# Patient Record
Sex: Male | Born: 1945 | Race: White | Hispanic: No | Marital: Married | Smoking: Former smoker
Health system: Southern US, Community
[De-identification: ages and names within clinical notes are randomized; demographics above are authoritative.]

## PROBLEM LIST (undated history)

## (undated) DIAGNOSIS — S73004A Unspecified dislocation of right hip, initial encounter: Secondary | ICD-10-CM

## (undated) DIAGNOSIS — L309 Dermatitis, unspecified: Secondary | ICD-10-CM

## (undated) DIAGNOSIS — S73005A Unspecified dislocation of left hip, initial encounter: Secondary | ICD-10-CM

## (undated) DIAGNOSIS — J302 Other seasonal allergic rhinitis: Secondary | ICD-10-CM

## (undated) DIAGNOSIS — I10 Essential (primary) hypertension: Secondary | ICD-10-CM

## (undated) DIAGNOSIS — J45909 Unspecified asthma, uncomplicated: Secondary | ICD-10-CM

## (undated) HISTORY — PX: TONSILLECTOMY: SUR1361

## (undated) HISTORY — DX: Unspecified dislocation of left hip, initial encounter: S73.005A

## (undated) HISTORY — DX: Dermatitis, unspecified: L30.9

## (undated) HISTORY — PX: ADENOIDECTOMY: SUR15

## (undated) HISTORY — DX: Essential (primary) hypertension: I10

## (undated) HISTORY — DX: Unspecified asthma, uncomplicated: J45.909

## (undated) HISTORY — DX: Unspecified dislocation of right hip, initial encounter: S73.004A

## (undated) HISTORY — DX: Other seasonal allergic rhinitis: J30.2

---

## 2013-10-10 ENCOUNTER — Other Ambulatory Visit: Payer: Self-pay | Admitting: Internal Medicine

## 2013-10-10 DIAGNOSIS — Z0000 Encounter for general adult medical examination without abnormal findings: Secondary | ICD-10-CM

## 2013-10-17 ENCOUNTER — Ambulatory Visit
Admission: RE | Admit: 2013-10-17 | Discharge: 2013-10-17 | Disposition: A | Discharge status: Home / Self Care | Payer: Medicare Other | Source: Ambulatory Visit | Attending: Internal Medicine | Admitting: Internal Medicine

## 2013-10-17 DIAGNOSIS — Z0000 Encounter for general adult medical examination without abnormal findings: Secondary | ICD-10-CM

## 2016-04-28 ENCOUNTER — Other Ambulatory Visit: Payer: Self-pay | Admitting: Allergy

## 2017-04-16 ENCOUNTER — Ambulatory Visit (INDEPENDENT_AMBULATORY_CARE_PROVIDER_SITE_OTHER): Payer: Medicare Other | Admitting: Allergy

## 2017-04-16 ENCOUNTER — Encounter: Payer: Self-pay | Admitting: Allergy

## 2017-04-16 VITALS — BP 134/70 | HR 112 | Temp 97.6°F | Resp 20 | Ht 63.0 in | Wt 158.2 lb

## 2017-04-16 DIAGNOSIS — L2084 Intrinsic (allergic) eczema: Secondary | ICD-10-CM

## 2017-04-16 DIAGNOSIS — J3089 Other allergic rhinitis: Secondary | ICD-10-CM

## 2017-04-16 DIAGNOSIS — J4520 Mild intermittent asthma, uncomplicated: Secondary | ICD-10-CM | POA: Diagnosis not present

## 2017-04-16 NOTE — Patient Instructions (Signed)
Eczema (hand)   - will start approval process for Dupixent as you have shown great improvement in eczema with use of Dupixenxt injectable medications every 2 weeks.     - will provide with prednisone 30mg  x 5 days to use the week before wedding to help clear hands    - will we try to get dupixent sample if possible before you are officially approved    - continue use of vaseline application multiple times a day and wear cotton gloves to help keep moisturised.      - will provide with Eucrisa samples you may try twice a day (may burn with initial applications).     Follow-up 3 months or sooner if needed

## 2017-04-16 NOTE — Progress Notes (Signed)
New Patient Note  RE: Grant Gonzalez MRN: [id number] DOB: 1946/08/15 Date of Office Visit: 04/16/2017  Referring provider: No ref. provider found Primary care provider: Kirby Funk, MD  Chief Complaint: eczema  History of present illness: Grant Gonzalez is a 71 y.o. male presenting today for evaluation of eczema.  He has eczema mostly on his palms that is rather severe.  He also reports he has an area on his feet that is not as bad as his hands. His hands are scaling and cracking and very erythematous.  He reports he has tried a variety of different topical steroid at different potencies that he reports that never been effective.  Upon naming a variety of different topical steroid creams he definitely reports he has tried clobetasol which had no effect.  He also feels like he has used either Elidel or Protopic in the past but doesn't remember which one.  He states he has at least 4-5 tubes of creams at home that he doesn't use because nothing has been effective except for Dupixent.  He does report using Vaseline for moisturization Mila Palmer applies multiple times a day and cover with a cotton white glove.  He was a part of the Dupixent trials for atopic dermatitis and reports he had a drastic improvement in his hands.  He even states that it worked weight faster than he expected it to. He reports he had no problems doing self injections every other week.  His last injection was in May.  He reports he was given the option to continue on with Dupixent however it was going to cost him $200 he was not able to do this.   He has had a flare in his eczema since being off Dupixent.   His daughter is getting married in 2 weeks and would like for his hands to look better.    He also has a history of mild intermittent asthma and he just recently was prescribed Aerospan by PCP that he has been using twice a day.  He has an albuterol inhaler that he has not needed to use with use of Aerospan.  He denies an  ynighttime awakenings and has not had any steroids for management of his asthma in past year.  He also takes singulair nightly.    He does have history of seasonal allergies however he states he has not had significant issues and does not take any antihistamines routinely.  He has used flonase in the past but none recently.     Per the clinical trial documentation he was enrolled in 3 Dupilimab trials  and had a drastic improvement. His BSA went from 39% initially to less than 10% in the course of 4 months. His EASI went from 17.6-0.8 in the same timeframe. His IgA was 3 and 0.  Review of systems: Review of Systems  Constitutional: Negative for chills, fever and malaise/fatigue.  HENT: Negative for congestion, ear discharge, ear pain, nosebleeds, sinus pain, sore throat and tinnitus.   Eyes: Negative for discharge and redness.  Respiratory: Positive for cough. Negative for sputum production, shortness of breath and wheezing.   Cardiovascular: Negative for chest pain.  Gastrointestinal: Negative for abdominal pain, constipation, diarrhea, heartburn, nausea and vomiting.  Musculoskeletal: Negative for joint pain.  Skin: Positive for itching and rash.  Neurological: Negative for headaches.    All other systems negative unless noted above in HPI  Past medical history: Past Medical History:  Diagnosis Date  . Asthma   . Eczema   .  Hip dislocation   . Hypertension   . Seasonal allergies     Past surgical history: Past Surgical History:  Procedure Laterality Date  . ADENOIDECTOMY    . TONSILLECTOMY      Family history:  Family History  Problem Relation Age of Onset  . Asthma Daughter   . Allergic rhinitis Neg Hx   . Angioedema Neg Hx   . Eczema Neg Hx   . Immunodeficiency Neg Hx   . Urticaria Neg Hx     Social history: He lives in a home with carpeting with central cooling. There is a dog in the home. There is no concern for water damage, mildew or roaches in the home he  is retired.  Social History Main Topics  . Smoking status: Former Smoker    Quit date: 08/17/1987  . Smokeless tobacco: Never Used     Medication List: Allergies as of 04/16/2017      Reactions   Penicillins Nausea Only      Medication List       Accurate as of 04/16/17  5:11 PM. Always use your most recent med list.          AEROSPAN 80 MCG/ACT Aers Generic drug:  Flunisolide HFA Inhale 2 puffs into the lungs 2 (two) times daily.   aspirin 81 MG chewable tablet Chew 81 mg by mouth daily.   lisinopril-hydrochlorothiazide 20-25 MG tablet Commonly known as:  PRINZIDE,ZESTORETIC   montelukast 10 MG tablet Commonly known as:  SINGULAIR   vitamin B-12 1000 MCG tablet Commonly known as:  CYANOCOBALAMIN Take 1,000 mcg by mouth daily.   Vitamin D (Cholecalciferol) 1000 units Caps Take 1 capsule by mouth daily.            Discharge Care Instructions        Start     Ordered   04/16/17 0000  Spirometry with Graph    Question Answer Comment  Where should this test be performed? Other   Basic spirometry Yes      04/16/17 1704      Known medication allergies: Allergies  Allergen Reactions  . Penicillins Nausea Only     Physical examination: Blood pressure 134/70, pulse (!) 112, temperature 97.6 F (36.4 C), temperature source Oral, resp. rate 20, height 5\' 3"  (1.6 m), weight 158 lb 3.2 oz (71.8 kg), SpO2 97 %.  General: Alert, interactive, in no acute distress. HEENT: PERRLA, TMs pearly gray, turbinates mildly edematous without discharge, post-pharynx non erythematous. Neck: Supple without lymphadenopathy. Lungs: Clear to auscultation without wheezing, rhonchi or rales. {no increased work of breathing. CV: Normal S1, S2 without murmurs. Abdomen: Nondistended, nontender. Skin: palms bilaterally very erythematous with cracking and several flaking over entire palmar surface. Extremities:  No clubbing, cyanosis or edema. Neuro:   Grossly  intact.  Diagnositics/Labs:  Spirometry: FEV1: 1.87L  77%, FVC: 3.06L  92%, ratio consistent with nonobstructive pattern normal for age  Assessment and plan:   Atopic dermatitis(hand)   - he has severe atopic dermatitis of his hands.  He has very impressive improvement in his hands while on Dupixent.  After completion of trial he was opted to continue however this was too expensive for him and he stopped use.  His eczema returned and is very problematic now.  He has tried varied potencies of topical steroid creams and he believes he has tried non-steroidal agent all without much benefit.  Will start approval process for Dupixent so that he can resume.     -  will provide with prednisone 30mg  x 5 days to use the week before wedding to help clear hands    - continue use of vaseline application multiple times a day and wear cotton gloves to help keep moisturised.      - will provide with Eucrisa samples you may try twice a day (may burn with initial applications).    Asthma, mild intermittent    - he will continue Aerospan use twice a day as previously directed    - have access to albuterol inhaler 2 puffs every 4-6 hours as needed for cough/wheeze/shortness of breath/chest tightness.  May use 15-20 minutes prior to activity.   Monitor frequency of use.    Asthma control goals:   Full participation in all desired activities (may need albuterol before activity)  Albuterol use two time or less a week on average (not counting use with activity)  Cough interfering with sleep two time or less a month  Oral steroids no more than once a year  No hospitalizations  Sesonal allergic rhinitis     - at this time no persistent symptoms     - use OTC antihistamine (Allegra, Zyrtec, Xyzal) as needed  Follow-up 3 months or sooner if needed   I appreciate the opportunity to take part in Revel's care. Please do not hesitate to contact me with questions.  Sincerely,   Margo Aye,  MD Allergy/Immunology [facility]

## 2017-07-14 ENCOUNTER — Ambulatory Visit: Payer: Medicare Other | Admitting: Allergy

## 2017-09-08 ENCOUNTER — Encounter: Payer: Self-pay | Admitting: Allergy

## 2017-09-08 ENCOUNTER — Ambulatory Visit: Payer: Medicare Other | Admitting: Allergy

## 2017-09-08 VITALS — BP 130/72 | HR 92 | Resp 18

## 2017-09-08 DIAGNOSIS — J3089 Other allergic rhinitis: Secondary | ICD-10-CM

## 2017-09-08 DIAGNOSIS — L2084 Intrinsic (allergic) eczema: Secondary | ICD-10-CM | POA: Diagnosis not present

## 2017-09-08 DIAGNOSIS — J4520 Mild intermittent asthma, uncomplicated: Secondary | ICD-10-CM | POA: Diagnosis not present

## 2017-09-08 NOTE — Patient Instructions (Addendum)
Eczema (hand)   - continue dupixent injections every 2 weeks   - continue use of vaseline application multiple times a day and wear cotton gloves to help keep moisturised as you need to.      - continue use of Eucrisa as needed  Asthma, mild intermittent    - continue Aerospan use twice a day during asthma flares/respiratory illnesses    - have access to albuterol inhaler 2 puffs every 4-6 hours as needed for cough/wheeze/shortness of breath/chest tightness.  May use 15-20 minutes prior to activity.   Monitor frequency of use.    Asthma control goals:   Full participation in all desired activities (may need albuterol before activity)  Albuterol use two time or less a week on average (not counting use with activity)  Cough interfering with sleep two time or less a month  Oral steroids no more than once a year  No hospitalizations  Sesonal allergic rhinitis     - use OTC antihistamine (Allegra, Zyrtec, Xyzal) as needed  Follow-up this fall or sooner if needed

## 2017-09-08 NOTE — Progress Notes (Signed)
Follow-up Note  RE: Grant Gonzalez MRN: [id number] DOB: 1945-10-21 Date of Office Visit: 09/08/2017   History of present illness: Grant Gonzalez is a 72 y.o. male presenting today for follow-up of eczema of the hands, asthma and allergic rhinitis.  He was last seen in the office on  04/16/17 by myself.  At this last visit he was having severe eczematous flare of his hands and was going to be in his daugther's wedding several weeks last.  We did provide with steroid pack at this time as Dupixent was not approved for him and he reported he was unable to pay for it.  He did decide to go ahead and cover the cost for the medication which he says has been very steep for him but he'd rather pay for it them suffer through his hand eczema.  He just received noticed in December that he had been approved for Dupixent through the end of this year.  He has been using dupixent every 2 weeks via self administration and is tolerating the injections without issue.   In regards to his asthma he states he is doing well.  Since last visit he states he has had 2 occasions when he has needed to use his Grant Gonzalez which he reports use during illnesses.  He denies any albuterol use or oral steroid needs.  He also denies any significant nasal allergy or ocular allergy symptoms at this time and does report will take antihistamine as needed.    Review of systems: Review of Systems  Constitutional: Negative for chills, fever and malaise/fatigue.  HENT: Negative for congestion, ear discharge, ear pain, nosebleeds and sore throat.   Eyes: Negative for pain, discharge and redness.  Respiratory: Negative for cough, shortness of breath and wheezing.   Cardiovascular: Negative for chest pain.  Gastrointestinal: Negative for abdominal pain, constipation, diarrhea, heartburn, nausea and vomiting.  Musculoskeletal: Negative for joint pain and myalgias.  Skin: Positive for itching and rash.       Improved symptoms  Neurological:  Negative for headaches.    All other systems negative unless noted above in HPI  Past medical/social/surgical/family history have been reviewed and are unchanged unless specifically indicated below.  No changes  Medication List: Allergies as of 09/08/2017      Reactions   Penicillins Nausea Only      Medication List        Accurate as of 09/08/17  3:52 PM. Always use your most recent med list.          AEROSPAN 80 MCG/ACT Aers Generic drug:  Flunisolide HFA Inhale 2 puffs into the lungs 2 (two) times daily.   aspirin 81 MG chewable tablet Chew 81 mg by mouth daily.   lisinopril-hydrochlorothiazide 20-25 MG tablet Commonly known as:  PRINZIDE,ZESTORETIC   montelukast 10 MG tablet Commonly known as:  SINGULAIR   vitamin B-12 1000 MCG tablet Commonly known as:  CYANOCOBALAMIN Take 1,000 mcg by mouth daily.   Vitamin D (Cholecalciferol) 1000 units Caps Take 1 capsule by mouth daily.       Known medication allergies: Allergies  Allergen Reactions  . Penicillins Nausea Only     Physical examination: Blood pressure 130/72, pulse 92, resp. rate 18, SpO2 98 %.  General: Alert, interactive, in no acute distress. HEENT: PERRLA, TMs pearly gray, turbinates minimally edematous without discharge, post-pharynx non erythematous. Neck: Supple without lymphadenopathy. Lungs: Clear to auscultation without wheezing, rhonchi or rales. {no increased work of breathing. CV: Normal S1, S2 without  murmurs. Abdomen: Nondistended, nontender. Skin: mild erythema with dryness to left palm otherwise hands look much improved since last visit. Extremities:  No clubbing, cyanosis or edema. Neuro:   Grossly intact.  Diagnositics/Labs:  Spirometry: FEV1: 1.86L  76%, FVC: 2.7L  81%, ratio consistent with nonobstructive pattern for age  Assessment and plan:   Eczema (hand)   - continue dupixent injections every 2 weeks.  Hand eczema is much improved on Dupixent.     - continue use  of vaseline application multiple times a day and wear cotton gloves to help keep moisturised as you need to.      - continue use of Eucrisa as needed  Asthma, mild intermittent    - continue Aerospan use twice a day during asthma flares/respiratory illnesses    - have access to albuterol inhaler 2 puffs every 4-6 hours as needed for cough/wheeze/shortness of breath/chest tightness.  May use 15-20 minutes prior to activity.   Monitor frequency of use.    Asthma control goals:   Full participation in all desired activities (may need albuterol before activity)  Albuterol use two time or less a week on average (not counting use with activity)  Cough interfering with sleep two time or less a month  Oral steroids no more than once a year  No hospitalizations  Sesonal allergic rhinitis     - use OTC antihistamine (Allegra, Zyrtec, Xyzal) as needed  Follow-up this fall or sooner if needed  I appreciate the opportunity to take part in Grant Gonzalez's care. Please do not hesitate to contact me with questions.  Sincerely,   Grant AyeShaylar Felma Pfefferle, MD Allergy/Immunology [facility]

## 2017-11-09 ENCOUNTER — Encounter: Payer: Self-pay | Admitting: Sports Medicine

## 2017-11-09 ENCOUNTER — Ambulatory Visit: Payer: Medicare Other | Admitting: Sports Medicine

## 2017-11-09 VITALS — BP 152/99 | HR 83 | Resp 16

## 2017-11-09 DIAGNOSIS — M79609 Pain in unspecified limb: Principal | ICD-10-CM

## 2017-11-09 DIAGNOSIS — I739 Peripheral vascular disease, unspecified: Secondary | ICD-10-CM | POA: Diagnosis not present

## 2017-11-09 DIAGNOSIS — M79676 Pain in unspecified toe(s): Secondary | ICD-10-CM | POA: Diagnosis not present

## 2017-11-09 DIAGNOSIS — B351 Tinea unguium: Secondary | ICD-10-CM

## 2017-11-09 NOTE — Progress Notes (Signed)
Subjective: Grant Gonzalez is a 72 y.o. male patient seen today in office with complaint of mildly painful thickened and elongated toenails; unable to trim. Patient denies history of Diabetes or Neuropathy. Patient has no other pedal complaints at this time.   Review of Systems  All other systems reviewed and are negative.    There are no active problems to display for this patient.   Current Outpatient Medications on File Prior to Visit  Medication Sig Dispense Refill  . aspirin 81 MG chewable tablet Chew 81 mg by mouth daily.    . DUPIXENT 300 MG/2ML SOSY Gets injection every 2 weeks    . Flunisolide HFA (AEROSPAN) 80 MCG/ACT AERS Inhale 2 puffs into the lungs 2 (two) times daily.    Marland Kitchen. lisinopril-hydrochlorothiazide (PRINZIDE,ZESTORETIC) 20-25 MG tablet     . montelukast (SINGULAIR) 10 MG tablet     . vitamin B-12 (CYANOCOBALAMIN) 1000 MCG tablet Take 1,000 mcg by mouth daily.    . Vitamin D, Cholecalciferol, 1000 units CAPS Take 1 capsule by mouth daily.     No current facility-administered medications on file prior to visit.     Allergies  Allergen Reactions  . Penicillins Nausea Only    Objective: Physical Exam  General: Well developed, nourished, no acute distress, awake, alert and oriented x 3  Vascular: Dorsalis pedis artery 1/4 bilateral, Posterior tibial artery 0/4 bilateral, skin temperature warm to warm proximal to distal bilateral lower extremities, Mild varicosities, no pedal hair present bilateral.  Neurological: Gross sensation present via light touch bilateral.   Dermatological: Skin is warm, dry, and supple bilateral, Nails 1-10 are tender, long, thick, and discolored with mild subungal debris, no webspace macerations present bilateral, no open lesions present bilateral, no callus/corns/hyperkeratotic tissue present bilateral. No signs of infection bilateral.  Musculoskeletal: No symptomatic boney deformities noted bilateral. Muscular strength within normal  limits without painon range of motion. No pain with calf compression bilateral.  Assessment and Plan:  Problem List Items Addressed This Visit    None    Visit Diagnoses    Pain due to onychomycosis of nail    -  Primary   PVD (peripheral vascular disease) (HCC)          -Examined patient.  -Discussed treatment options for painful mycotic nails. -Mechanically debrided and reduced mycotic nails with sterile nail nipper and dremel nail file without incident. -Patient to return as needed for follow up evaluation or sooner if symptoms worsen.  Asencion Islamitorya Hinley Brimage, DPM

## 2018-03-03 ENCOUNTER — Ambulatory Visit: Payer: Medicare Other | Admitting: Allergy

## 2018-03-03 ENCOUNTER — Encounter: Payer: Self-pay | Admitting: Allergy

## 2018-03-03 VITALS — BP 140/80 | HR 75 | Temp 97.5°F | Resp 16 | Ht 66.0 in | Wt 156.6 lb

## 2018-03-03 DIAGNOSIS — J3089 Other allergic rhinitis: Secondary | ICD-10-CM | POA: Diagnosis not present

## 2018-03-03 DIAGNOSIS — J4520 Mild intermittent asthma, uncomplicated: Secondary | ICD-10-CM | POA: Diagnosis not present

## 2018-03-03 DIAGNOSIS — L2084 Intrinsic (allergic) eczema: Secondary | ICD-10-CM

## 2018-03-03 NOTE — Patient Instructions (Addendum)
Eczema (hand)   - continue dupixent injections every 2 weeks.  He has had great results with use of Dupixent   -Continue at least twice a day moisturization with emollients like Vaseline, Aquaphor, CeraVe, Eucerin    - continue use of Eucrisa as needed if flared  Asthma, mild intermittent    - continue Arnuity 1 puff once a day    - have access to albuterol inhaler 2 puffs every 4-6 hours as needed for cough/wheeze/shortness of breath/chest tightness.  May use 15-20 minutes prior to activity.   Monitor frequency of use.    Asthma control goals:   Full participation in all desired activities (may need albuterol before activity)  Albuterol use two time or less a week on average (not counting use with activity)  Cough interfering with sleep two time or less a month  Oral steroids no more than once a year  No hospitalizations  Sesonal allergic rhinitis     - use OTC antihistamine (Allegra, Zyrtec, Xyzal) as needed  Follow-up 6-9 months or sooner if needed

## 2018-03-03 NOTE — Progress Notes (Signed)
Follow-up Note  RE: Grant Gonzalez MRN: [id number] DOB: [birth year] Date of Office Visit: 03/03/2018   History of present illness: Grant Gonzalez is a 72 y.o. male presenting today for follow-up of atopic dermatitis, asthma and allergic rhinitis.  He was last seen in the office on September 08, 2017 by myself.  He denies any major changes to his health, any surgeries or hospitalizations since this visit.  He continues on Dupixent injections every 2 weeks for his atopic dermatitis with good results.  He primarily has hand eczema that is being well controlled with the Dupixent.  He denies any site injection issues.  He does have access to [place] if he needed to for any flares.   With his asthma he states that his PCP changed him from [place] to Arnuity 200 he states that he does feel that the Arnuity is maintaining his asthma better than the Aerospan was.  He states he has not needed to use his albuterol in months.  He denies any nighttime awakenings.  He has not required any urgent care or ED visits and no oral steroid needs. With his allergies he reports some occasional runny nose but nothing severe enough that he takes any medications for.  He states he has not needed to use any over-the-counter antihistamine in over a month.  Review of systems: Review of Systems  Constitutional: Negative for chills, fever and malaise/fatigue.  HENT: Positive for congestion. Negative for ear discharge, ear pain, nosebleeds, sinus pain and sore throat.   Eyes: Negative for pain, discharge and redness.  Respiratory: Negative for cough, shortness of breath and wheezing.   Cardiovascular: Negative for chest pain.  Gastrointestinal: Negative for abdominal pain, constipation, diarrhea, heartburn, nausea and vomiting.  Musculoskeletal: Negative for joint pain.  Skin: Negative for itching and rash.  Neurological: Negative for headaches.    All other systems negative unless noted above in HPI  Past  medical/social/surgical/family history have been reviewed and are unchanged unless specifically indicated below.  No changes  Medication List: Allergies as of 03/03/2018      Reactions   Penicillins Nausea Only      Medication List        Accurate as of 03/03/18  1:29 PM. Always use your most recent med list.          AEROSPAN 80 MCG/ACT Aers Generic drug:  Flunisolide HFA Inhale 2 puffs into the lungs 2 (two) times daily.   ARNUITY ELLIPTA 200 MCG/ACT Aepb Generic drug:  Fluticasone Furoate TAKE 1 PUFF BY MOUTH EVERY DAY   DUPIXENT 300 MG/2ML Sosy Generic drug:  Dupilumab Gets injection every 2 weeks   lisinopril-hydrochlorothiazide 20-25 MG tablet Commonly known as:  PRINZIDE,ZESTORETIC   montelukast 10 MG tablet Commonly known as:  SINGULAIR   vitamin B-12 1000 MCG tablet Commonly known as:  CYANOCOBALAMIN Take 1,000 mcg by mouth daily.   Vitamin D (Cholecalciferol) 1000 units Caps Take 1 capsule by mouth daily.       Known medication allergies: Allergies  Allergen Reactions  . Penicillins Nausea Only     Physical examination: Blood pressure 140/80, pulse 75, temperature (!) 97.5 F (36.4 C), temperature source Oral, resp. rate 16, height 5\' 6"  (1.676 m), weight 156 lb 9.6 oz (71 kg), SpO2 97 %.  General: Alert, interactive, in no acute distress. HEENT: PERRLA, TMs pearly gray, turbinates minimally edematous without discharge, post-pharynx non erythematous. Neck: Supple without lymphadenopathy. Lungs: Clear to auscultation without wheezing, rhonchi or rales. {no increased work  of breathing. CV: Normal S1, S2 without murmurs. Abdomen: Nondistended, nontender. Skin: Palms of hands bilaterally are slightly dry.  Large bruise on his left forearm. Extremities:  No clubbing, cyanosis or edema. Neuro:   Grossly intact.  Diagnositics/Labs:  Spirometry: FEV1: 2.68L 96%, FVC: 2.86L 75%, ratio consistent with nonobstructive pattern for age  Assessment and  plan:   Eczema (hand)   - continue dupixent injections every 2 weeks.  He has had great results with use of Dupixent   -Continue at least twice a day moisturization with emollients like Vaseline, Aquaphor, CeraVe, Eucerin    - continue use of Eucrisa as needed if flared  Asthma, mild intermittent    - continue Arnuity 200mcg 1 puff once a day    - have access to albuterol inhaler 2 puffs every 4-6 hours as needed for cough/wheeze/shortness of breath/chest tightness.  May use 15-20 minutes prior to activity.   Monitor frequency of use.    Asthma control goals:   Full participation in all desired activities (may need albuterol before activity)  Albuterol use two time or less a week on average (not counting use with activity)  Cough interfering with sleep two time or less a month  Oral steroids no more than once a year  No hospitalizations  Sesonal allergic rhinitis     - use OTC antihistamine (Allegra, Zyrtec, Xyzal) as needed  Follow-up 6 to 9 months or sooner if needed  I appreciate the opportunity to take part in Grant Gonzalez's care. Please do not hesitate to contact me with questions.  Sincerely,   Margo AyeShaylar Josef Tourigny, MD Allergy/Immunology [facility]

## 2018-03-08 ENCOUNTER — Other Ambulatory Visit: Payer: Self-pay | Admitting: *Deleted

## 2018-03-08 MED ORDER — DUPIXENT 300 MG/2ML ~~LOC~~ SOSY: 300.0000 mg | PREFILLED_SYRINGE | SUBCUTANEOUS | 11 refills | Status: DC

## 2018-03-08 NOTE — Progress Notes (Signed)
Rx sent to [facility]

## 2018-10-11 ENCOUNTER — Telehealth: Payer: Self-pay

## 2018-10-11 NOTE — Telephone Encounter (Signed)
Patient states he has been having some vision issues starting towards the end of 2019. He thinks it may be a side effect to Dupixent. He states he doesn't think he is having any other issues.   Please Advise

## 2018-10-11 NOTE — Telephone Encounter (Signed)
Please advise 

## 2018-10-11 NOTE — Telephone Encounter (Signed)
He needs to come in for visit then to determine if he has conjunctivitis.   He should also see an eye doctor if he is having vision changes.  Dupixent can cause conjunctivitis however we do not normally see vision changes with it.

## 2018-10-11 NOTE — Telephone Encounter (Signed)
Informed patient that we typically on see conjunctivitis with Dupixent. He states he has an eye doctor appointment next week but wanted to double check since he has been on Dupixent for awhile now.

## 2018-10-28 ENCOUNTER — Other Ambulatory Visit: Payer: Self-pay | Admitting: Internal Medicine

## 2018-10-28 ENCOUNTER — Ambulatory Visit
Admission: RE | Admit: 2018-10-28 | Discharge: 2018-10-28 | Disposition: A | Discharge status: Home / Self Care | Payer: Medicare Other | Source: Ambulatory Visit | Attending: Internal Medicine | Admitting: Internal Medicine

## 2018-10-28 DIAGNOSIS — M25562 Pain in left knee: Secondary | ICD-10-CM

## 2018-12-28 ENCOUNTER — Other Ambulatory Visit: Payer: Self-pay | Admitting: Allergy

## 2018-12-28 NOTE — Telephone Encounter (Signed)
Please advise 

## 2019-05-23 ENCOUNTER — Ambulatory Visit
Admission: RE | Admit: 2019-05-23 | Discharge: 2019-05-23 | Disposition: A | Discharge status: Home / Self Care | Payer: Medicare Other | Source: Ambulatory Visit | Attending: Internal Medicine | Admitting: Internal Medicine

## 2019-05-23 ENCOUNTER — Other Ambulatory Visit: Payer: Self-pay | Admitting: Internal Medicine

## 2019-05-23 DIAGNOSIS — R053 Chronic cough: Secondary | ICD-10-CM

## 2019-05-23 DIAGNOSIS — R05 Cough: Secondary | ICD-10-CM

## 2019-08-03 ENCOUNTER — Other Ambulatory Visit: Payer: Self-pay

## 2019-08-03 ENCOUNTER — Ambulatory Visit (INDEPENDENT_AMBULATORY_CARE_PROVIDER_SITE_OTHER): Payer: Medicare Other | Admitting: Allergy

## 2019-08-03 ENCOUNTER — Encounter: Payer: Self-pay | Admitting: Allergy

## 2019-08-03 VITALS — BP 146/84 | HR 95 | Temp 98.0°F | Resp 18 | Ht 64.0 in | Wt 166.8 lb

## 2019-08-03 DIAGNOSIS — J3089 Other allergic rhinitis: Secondary | ICD-10-CM | POA: Diagnosis not present

## 2019-08-03 DIAGNOSIS — L2084 Intrinsic (allergic) eczema: Secondary | ICD-10-CM | POA: Diagnosis not present

## 2019-08-03 DIAGNOSIS — J4520 Mild intermittent asthma, uncomplicated: Secondary | ICD-10-CM

## 2019-08-03 NOTE — Patient Instructions (Addendum)
Eczema (hand)   - continue dupixent injections every 2 weeks.  He has had great results with use of Dupixent   - continue at least twice a day moisturization with emollients like Vaseline, Aquaphor, CeraVe, Eucerin   - continue use of Eucrisa as needed if flared   - do not think blurry vision is related to Dupixent use.  Do recommend you get in to see your optometrist, Dr. Nicki Reaper, soon for routine check-up and I will call his office to let him know regarding Dupixent use.    Asthma, mild intermittent    - Dupixent also helps with asthma control    - have access to albuterol inhaler 2 puffs every 4-6 hours as needed for cough/wheeze/shortness of breath/chest tightness.  May use 15-20 minutes prior to activity.   Monitor frequency of use.    Asthma control goals:   Full participation in all desired activities (may need albuterol before activity)  Albuterol use two time or less a week on average (not counting use with activity)  Cough interfering with sleep two time or less a month  Oral steroids no more than once a year  No hospitalizations  Sesonal allergic rhinitis     - use OTC antihistamine (Allegra, Zyrtec, Xyzal) as needed     - use Flonsae 2 sprays each nostril daily as needed for nasal congestion/drainage.  Use for 1-2 weeks at a time before stopping once symptoms improve  Follow-up 6-12 months or sooner if needed

## 2019-08-03 NOTE — Progress Notes (Signed)
Follow-up Note  RE: Karel Turpen MRN: [id number] DOB: [birth year] Date of Office Visit: 08/03/2019   History of present illness: Grant Gonzalez is a 73 y.o. male presenting today for follow-up of eczema, asthma and allergic rhinitis.  He was last seen in the office on 03/03/18 by myself.   He is on dupixent for hand eczema control which has been well controlled on dupixent every 2 weeks.  He denies any issues with the injections themselves.  He does state since it has gotten colder and drier that he is having my dryness but is using moisturizer.  He states he has not used any inhalers over the past year.  He denies any daytime or nighttime symptoms.  He denies any steroid needs, ED/UC visits.  With his allergic rhinitis he reports some nasal congestion/drainage with cough and was advised to use Flonase which he states helps.   He also states he has been having some blurred vision however states is not a new issue.  He states he saw his optometrist, Dr. Nicki Reaper, about a year ago.  He mentioned potentially having cataracts.  Denies eye pain.  No redness or discharge.  No headaches.  He states he wants to make a follow-up visit with eye doctor in the next quarter.   He became a grandfather yesterday!  Review of systems: Review of Systems  Constitutional: Negative.   HENT: Positive for congestion.   Eyes: Positive for blurred vision. Negative for pain, discharge and redness.  Respiratory: Negative.   Cardiovascular: Negative.   Gastrointestinal: Negative.   Musculoskeletal: Negative.   Skin: Negative.   Neurological: Negative.     All other systems negative unless noted above in HPI  Past medical/social/surgical/family history have been reviewed and are unchanged unless specifically indicated below.  No changes  Medication List: Current Outpatient Medications  Medication Sig Dispense Refill  . DUPIXENT 300 MG/2ML SOSY INJECT 300MG  SUBCUTANEOUSLY EVERY 14 DAYS 12 mL 11  .  lisinopril-hydrochlorothiazide (PRINZIDE,ZESTORETIC) 20-25 MG tablet     . montelukast (SINGULAIR) 10 MG tablet     . vitamin B-12 (CYANOCOBALAMIN) 1000 MCG tablet Take 1,000 mcg by mouth daily.    . Vitamin D, Cholecalciferol, 1000 units CAPS Take 1 capsule by mouth daily.     No current facility-administered medications for this visit.     Known medication allergies: Allergies  Allergen Reactions  . Penicillins Nausea Only     Physical examination: Blood pressure (!) 146/84, pulse 95, temperature 98 F (36.7 C), temperature source Temporal, resp. rate 18, height 5\' 4"  (1.626 m), weight 166 lb 12.8 oz (75.7 kg), SpO2 98 %.  General: Alert, interactive, in no acute distress. HEENT: PERRLA, no scleral injection, TMs pearly gray, turbinates minimally edematous without discharge, post-pharynx non erythematous. Neck: Supple without lymphadenopathy. Lungs: Clear to auscultation without wheezing, rhonchi or rales. {no increased work of breathing. CV: Normal S1, S2 without murmurs. Abdomen: Nondistended, nontender. Skin: Warm and dry, without lesions or rashes. Extremities:  No clubbing, cyanosis or edema. Neuro:   Grossly intact.  Diagnositics/Labs:  Spirometry: FEV1: 1.92L 76%, FVC: 2.86L 83%, ratio consistent with nonobstructive pattern.  normal for age  Assessment and plan:   Eczema (hand)   - continue dupixent injections every 2 weeks.  He has had great results with use of Dupixent   - continue at least twice a day moisturization with emollients like Vaseline, Aquaphor, CeraVe, Eucerin   - continue use of Eucrisa as needed if flared   - do  not think blurry vision is related to Dupixent use.  However I did call Dr. Roby Lofts office to make aware of symptoms and that he is on Dupixent and he advised pt make a "pathology" appt to evaluate further his symptoms.  Will notify pt of this recommendation.   Asthma, mild intermittent    - Dupixent also helps with asthma control    - have  access to albuterol inhaler 2 puffs every 4-6 hours as needed for cough/wheeze/shortness of breath/chest tightness.  May use 15-20 minutes prior to activity.   Monitor frequency of use.    Asthma control goals:   Full participation in all desired activities (may need albuterol before activity)  Albuterol use two time or less a week on average (not counting use with activity)  Cough interfering with sleep two time or less a month  Oral steroids no more than once a year  No hospitalizations  Sesonal allergic rhinitis     - use OTC antihistamine (Allegra, Zyrtec, Xyzal) as needed     - use Flonsae 2 sprays each nostril daily as needed for nasal congestion/drainage.  Use for 1-2 weeks at a time before stopping once symptoms improve  Follow-up 6-12 months or sooner if needed  I appreciate the opportunity to take part in Naif's care. Please do not hesitate to contact me with questions.  Sincerely,   Margo Aye, MD Allergy/Immunology [facility]

## 2019-08-04 NOTE — Progress Notes (Signed)
Called and talked with patient and advised. Patient verbalized understanding and will call Dr. Bary Leriche office to schedule an appointment.

## 2019-08-09 ENCOUNTER — Telehealth: Payer: Self-pay | Admitting: *Deleted

## 2019-08-09 NOTE — Telephone Encounter (Signed)
-----   Message from Delmar, MD sent at 08/03/2019  4:35 PM EST ----- Please call pt and let him know the following:"As discussed at visit I did call Dr. Bary Leriche office to make sure he is aware of your Dupixent use and discussed blurred vision.  He agreed you should call Dr. Bary Leriche office for a visit and need to tell them you are having blurry vision for a sooner appointment.  He stated they do have appointments available if having an eye issue.  Recommend he call to schedule this visit soon."Michele Kerlin --- can go ahead and renew his dupixent for the year

## 2019-08-09 NOTE — Telephone Encounter (Signed)
L/M for patient to contact me. I have done his approval and wanted to make sure he knows to schedule appt with Dr Nicki Reaper in regards to vision issues

## 2019-08-09 NOTE — Telephone Encounter (Signed)
Patient aware of info and will make appt  Dr Nicki Reaper

## 2019-09-21 ENCOUNTER — Ambulatory Visit: Payer: Medicare Other

## 2019-09-30 ENCOUNTER — Ambulatory Visit: Payer: Medicare Other | Attending: Internal Medicine

## 2019-09-30 DIAGNOSIS — Z23 Encounter for immunization: Secondary | ICD-10-CM | POA: Insufficient documentation

## 2019-09-30 NOTE — Progress Notes (Signed)
   Covid-19 Vaccination Clinic  Name:  Keelon Zurn    MRN: [id number] DOB: 06-12-1946  09/30/2019  Mr. Winnett was observed post Covid-19 immunization for 15 minutes without incidence. He was provided with Vaccine Information Sheet and instruction to access the V-Safe system.   Mr. Pecha was instructed to call 911 with any severe reactions post vaccine: Marland Kitchen Difficulty breathing  . Swelling of your face and throat  . A fast heartbeat  . A bad rash all over your body  . Dizziness and weakness    Immunizations Administered    Name Date Dose VIS Date Route   Pfizer COVID-19 Vaccine 09/30/2019  1:36 PM 0.3 mL 08/04/2019 Intramuscular   Manufacturer: [employer], [employer]   Lot: [id number]   NDC: [address]-7

## 2019-10-12 ENCOUNTER — Ambulatory Visit: Payer: Medicare Other

## 2019-10-22 ENCOUNTER — Ambulatory Visit: Payer: Medicare Other

## 2019-10-25 ENCOUNTER — Ambulatory Visit: Payer: Medicare Other | Attending: Internal Medicine

## 2019-10-25 DIAGNOSIS — Z23 Encounter for immunization: Secondary | ICD-10-CM | POA: Insufficient documentation

## 2019-10-25 NOTE — Progress Notes (Signed)
   Covid-19 Vaccination Clinic  Name:  Grant Gonzalez    MRN: [id number] DOB: 08-02-1946  10/25/2019  Mr. [name] was observed post Covid-19 immunization for 15 minutes without incident. He was provided with Vaccine Information Sheet and instruction to access the V-Safe system.   Mr. [name] was instructed to call 911 with any severe reactions post vaccine: Marland Kitchen Difficulty breathing  . Swelling of face and throat  . A fast heartbeat  . A bad rash all over body  . Dizziness and weakness   Immunizations Administered    Name Date Dose VIS Date Route   Pfizer COVID-19 Vaccine 10/25/2019 10:02 AM 0.3 mL 08/04/2019 Intramuscular   Manufacturer: [employer], [employer]   Lot: [id number]   NDC: [address]-4

## 2020-01-01 ENCOUNTER — Other Ambulatory Visit: Payer: Self-pay | Admitting: Allergy

## 2020-01-01 NOTE — Telephone Encounter (Signed)
Please advise 

## 2020-01-13 IMAGING — CR DG CHEST 2V
2 series · 2 of 2 positions shown · non-contrast
Comparison: None.

CLINICAL DATA: Chronic cough, shortness of breath.  Former smoker.

EXAM:
CHEST - 2 VIEW

[w chest pa]
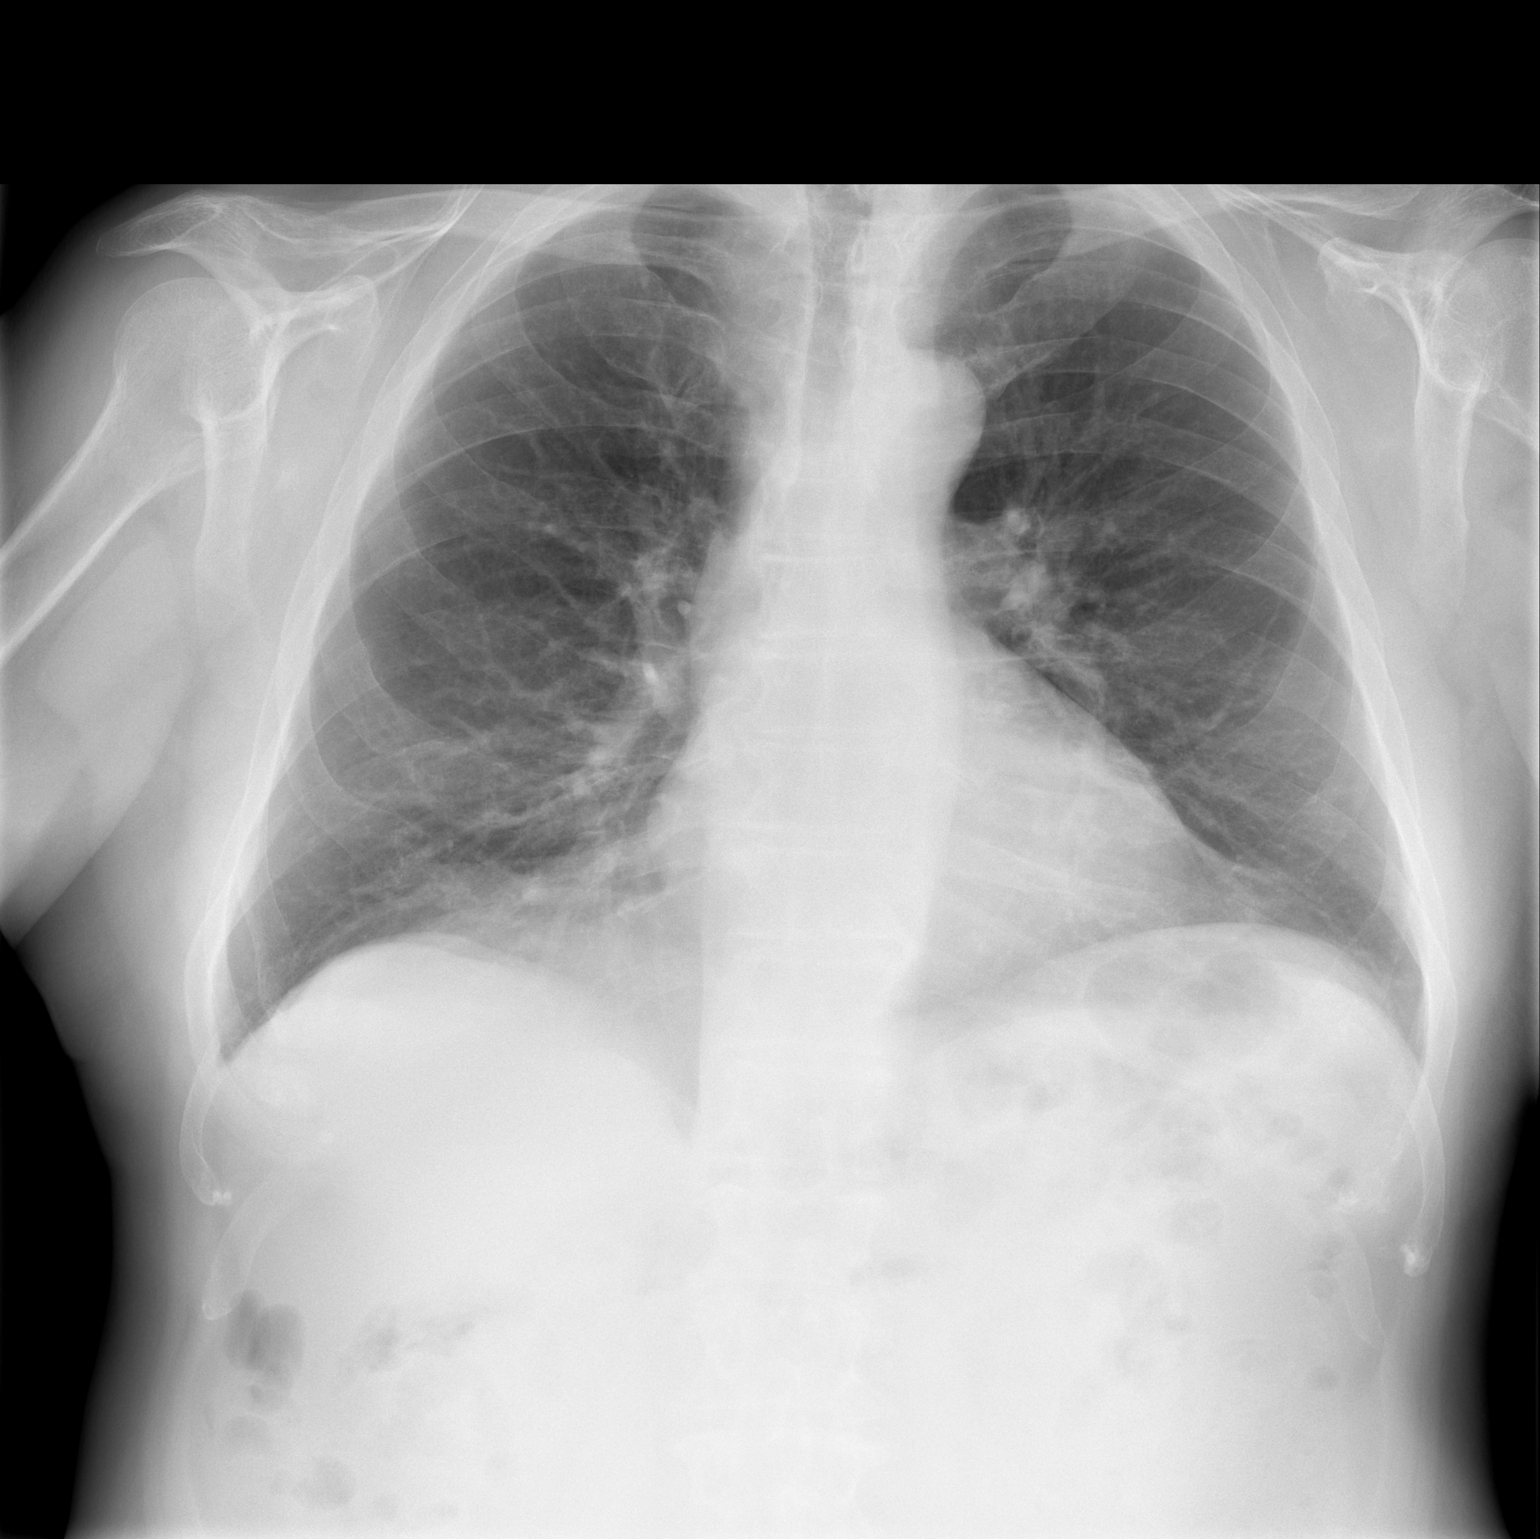

[w chest lat]
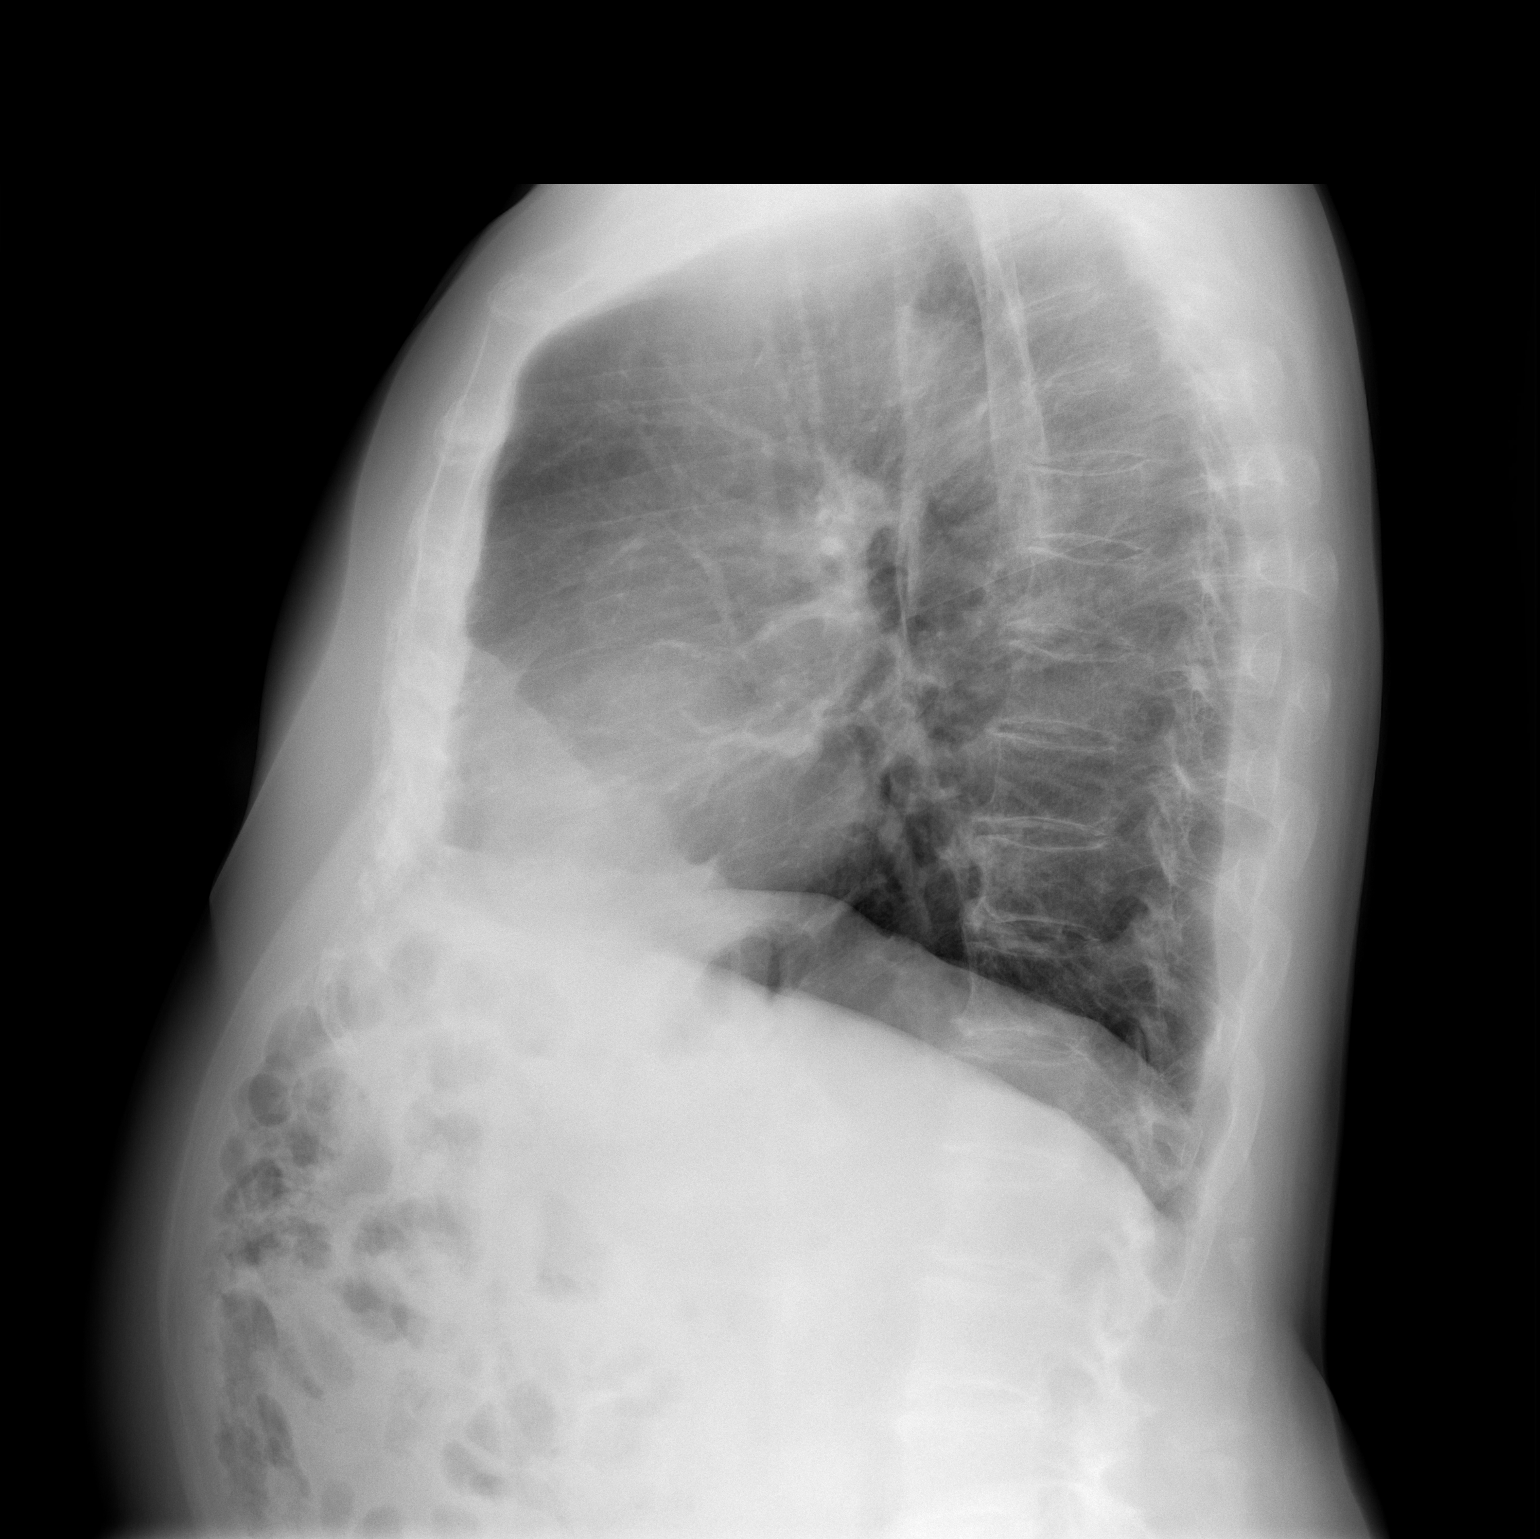

[2 of 2 positions shown; findings below may reference images not displayed]

FINDINGS: Heart size and cardiomediastinal contours are normal.

Lungs are mildly hyperinflated without focal opacity or evidence of
pleural effusion.

No acute bone finding.
IMPRESSION: No active cardiopulmonary disease.

## 2020-03-12 ENCOUNTER — Encounter: Payer: Self-pay | Admitting: Sports Medicine

## 2020-03-12 ENCOUNTER — Other Ambulatory Visit: Payer: Self-pay

## 2020-03-12 ENCOUNTER — Ambulatory Visit: Payer: Medicare Other | Admitting: Sports Medicine

## 2020-03-12 DIAGNOSIS — L603 Nail dystrophy: Secondary | ICD-10-CM | POA: Diagnosis not present

## 2020-03-12 DIAGNOSIS — M79674 Pain in right toe(s): Secondary | ICD-10-CM | POA: Diagnosis not present

## 2020-03-12 DIAGNOSIS — G629 Polyneuropathy, unspecified: Secondary | ICD-10-CM

## 2020-03-12 NOTE — Progress Notes (Signed)
Subjective: Grant Gonzalez is a 74 y.o. male patient presents to office today complaining of pain to severely thickened toenail reports that the pain is so bad that the toe because of the thickness it limits his ability to work out as well as he has more pain with his neuropathy reports that he has been told by his PCP to stop drinking because his alcohol could be contributing to this problem.  Patient denies any redness warmth drainage or any signs of infection to the right great toe.   There are no problems to display for this patient.   Current Outpatient Medications on File Prior to Visit  Medication Sig Dispense Refill   DUPIXENT 300 MG/2ML prefilled syringe INJECT 300MG  SUBCUTANEOUSLY EVERY OTHER WEEK 12 mL 3   lisinopril-hydrochlorothiazide (PRINZIDE,ZESTORETIC) 20-25 MG tablet      montelukast (SINGULAIR) 10 MG tablet      vitamin B-12 (CYANOCOBALAMIN) 1000 MCG tablet Take 1,000 mcg by mouth daily.     Vitamin D, Cholecalciferol, 1000 units CAPS Take 1 capsule by mouth daily.     No current facility-administered medications on file prior to visit.    Allergies  Allergen Reactions   Penicillins Nausea Only    Objective:  There were no vitals filed for this visit.  General: Well developed, nourished, in no acute distress, alert and oriented x3   Dermatology: Skin is warm, dry and supple bilateral.  Right hallux nail appears to be severely thickened with trauma lines present (-) Erythema. (-) Edema. (-) serosanguous drainage present. The remaining nails appear unremarkable at this time. There are no open sores, lesions or other signs of infection present.  Vascular: Dorsalis Pedis artery and Posterior Tibial artery pedal pulses are 1/4 bilateral with immedate capillary fill time. Scant Pedal hair growth present. No lower extremity edema.   Neruologic: Grossly intact via light touch bilateral.Numbness to both feet.   Musculoskeletal: Tenderness to palpation of the Right  hallux nail, Muscular strength within normal limits in all groups bilateral.   Assesement and Plan: Problem List Items Addressed This Visit    None    Visit Diagnoses    Onychodystrophy    -  Primary   Toe pain, right       Neuropathy          -Discussed treatment alternatives and plan of care; Explained permanent/temporary nail avulsion and post procedure course to patient. Patient elects for PNA Right hallux total nail removal - After a verbal and written consent, injected 3 ml of a 50:50 mixture of 2% plain lidocaine and 0.5% plain marcaine in a normal hallux block fashion. Next, a betadine prep was performed. Anesthesia was tested and found to be appropriate.  The offending right hallux nail completely was then incised from the hyponychium to the epinychium. The offending nail border was removed and cleared from the field. The area was curretted for any remaining nail or spicules. Phenol application performed and the area was then flushed with alcohol and dressed with antibiotic cream and a dry sterile dressing. -Patient was instructed to leave the dressing intact for today and begin soaking in a weak solution of betadine or Epsom salt and water tomorrow. Patient was instructed to soak for 15-20 minutes each day and apply neosporin/corticosporin and a gauze or bandaid dressing each day. -Patient was instructed to monitor the toe for signs of infection and return to office if toe becomes red, hot or swollen. -Advised ice, elevation, and tylenol or motrin if needed for pain.  -  Advised patient to refrain from working out for the next day or two -Patient is to return in 2 weeks for follow up care/nail check or sooner if problems arise.  Asencion Islam, DPM

## 2020-03-28 ENCOUNTER — Ambulatory Visit: Payer: Medicare Other | Admitting: Sports Medicine

## 2020-04-04 ENCOUNTER — Encounter: Payer: Self-pay | Admitting: Sports Medicine

## 2020-04-04 ENCOUNTER — Ambulatory Visit (INDEPENDENT_AMBULATORY_CARE_PROVIDER_SITE_OTHER): Payer: Medicare Other | Admitting: Sports Medicine

## 2020-04-04 ENCOUNTER — Other Ambulatory Visit: Payer: Self-pay

## 2020-04-04 DIAGNOSIS — M79674 Pain in right toe(s): Secondary | ICD-10-CM

## 2020-04-04 DIAGNOSIS — Z9889 Other specified postprocedural states: Secondary | ICD-10-CM

## 2020-04-04 DIAGNOSIS — G629 Polyneuropathy, unspecified: Secondary | ICD-10-CM

## 2020-04-04 NOTE — Progress Notes (Signed)
Subjective: Grant Gonzalez is a 74 y.o. male patient returns to office today for follow up evaluation after having Right hallux total nail avulsion procedure performed on 03/12/2020.  Patient reports that it is healing up well with very little drainage and very minimal pain.  No other pedal complaints noted.  There are no problems to display for this patient.   Current Outpatient Medications on File Prior to Visit  Medication Sig Dispense Refill  . BESIVANCE 0.6 % SUSP Place 1 drop into the left eye 3 (three) times daily.    . chlorthalidone (HYGROTON) 25 MG tablet Take 25 mg by mouth daily.    . chlorthalidone (HYGROTON) 25 MG tablet Take 25 mg by mouth daily.    . DUPIXENT 300 MG/2ML prefilled syringe INJECT 300MG  SUBCUTANEOUSLY EVERY OTHER WEEK 12 mL 3  . DUREZOL 0.05 % EMUL Place 1 drop into the right eye 3 (three) times daily.    gatifloxacin (ZYMAXID) 0.5 % SOLN Place 1 drop into the right eye 4 (four) times daily.    Marland Kitchen lisinopril-hydrochlorothiazide (PRINZIDE,ZESTORETIC) 20-25 MG tablet     . losartan-hydrochlorothiazide (HYZAAR) 100-25 MG tablet Take 1 tablet by mouth daily.    . montelukast (SINGULAIR) 10 MG tablet     . PROLENSA 0.07 % SOLN Place 1 drop into the left eye at bedtime.    Marland Kitchen telmisartan (MICARDIS) 80 MG tablet Take 80 mg by mouth daily.    Marland Kitchen telmisartan (MICARDIS) 80 MG tablet Take 80 mg by mouth daily.    . vitamin B-12 (CYANOCOBALAMIN) 1000 MCG tablet Take 1,000 mcg by mouth daily.    . Vitamin D, Cholecalciferol, 1000 units CAPS Take 1 capsule by mouth daily.     No current facility-administered medications on file prior to visit.    Allergies  Allergen Reactions  . Penicillins Nausea Only    Objective:  General: Well developed, nourished, in no acute distress, alert and oriented x3   Dermatology: Skin is warm, dry and supple bilateral.  Right hallux nail bed appears to be clean, dry, with mild granular tissue and surrounding eschar/scab. (-) Erythema. (-)  Edema. (-) serosanguous drainage present. The remaining nails appear unremarkable at this time. There are no other lesions or other signs of infection present.  Neurovascular status: Intact. No lower extremity swelling; No pain with calf compression bilateral.  Musculoskeletal: No pain to palpation to right hallux nail bed.  Assesement and Plan: Problem List Items Addressed This Visit    None    Visit Diagnoses    S/P nail surgery    -  Primary   Toe pain, right       Neuropathy          -Examined patient  -Cleansed right hallux nail bed and apply antibiotic cream and Band-Aid -Discussed plan of care with patient. -Patient may leave open to air at night and may discontinue soaking as long as there is no more tenderness. -Educated patient on long term care after nail surgery. -Patient was instructed to monitor the toe for reoccurrence and signs of infection; Patient advised to return to office or go to ER if toe becomes red, hot or swollen. -Patient is to return as needed or sooner if problems arise.  Marland Kitchen, DPM

## 2020-07-17 ENCOUNTER — Telehealth: Payer: Self-pay | Admitting: *Deleted

## 2020-07-17 NOTE — Telephone Encounter (Signed)
L/m for patient to contact office to make appt for Dupixent reapproval

## 2020-08-04 NOTE — Patient Instructions (Addendum)
Eczema (hand) Continue Dupixent injections every 2 weeks. Continue twice a day moisturization program Continue Eucrisa 2% ointment 1 application twice a day as needed to red itchy areas Please call our office [phone] to discuss finger cramps  Mild intermittent asthma Continue albuterol 2 puffs every 4 hours as needed for cough, wheeze, tightness in chest, or shortness of breath.  Also may use 2 puffs 5 to 15 minutes prior to exercise Continue Singulair as per your primary care physician  Seasonal allergic rhinitis May use an over-the-counter antihistamine once a day as needed for runny nose such as Claritin, Zyrtec, Allegra, or Xyzal Continue fluticasone nasal spray using 2 sprays each nostril once a day as needed for stuffy nose Consider azelastine nasal spray using 1 spray each nostril twice a day as needed for drainage. He will call our office if he would like this prescription sent  Please let us know if this treatment plan is not working well for you Schedule a follow-up appointment in 6-12 months

## 2020-08-05 ENCOUNTER — Encounter: Payer: Self-pay | Admitting: Family

## 2020-08-05 ENCOUNTER — Ambulatory Visit: Payer: Medicare Other | Admitting: Family

## 2020-08-05 ENCOUNTER — Telehealth: Payer: Self-pay | Admitting: Family

## 2020-08-05 ENCOUNTER — Other Ambulatory Visit: Payer: Self-pay

## 2020-08-05 VITALS — BP 140/80 | HR 110 | Temp 98.5°F | Resp 14 | Ht 62.75 in | Wt 157.2 lb

## 2020-08-05 DIAGNOSIS — J3089 Other allergic rhinitis: Secondary | ICD-10-CM | POA: Diagnosis not present

## 2020-08-05 DIAGNOSIS — J4520 Mild intermittent asthma, uncomplicated: Secondary | ICD-10-CM

## 2020-08-05 DIAGNOSIS — L2084 Intrinsic (allergic) eczema: Secondary | ICD-10-CM

## 2020-08-05 NOTE — Telephone Encounter (Signed)
Patient is returning your call.  

## 2020-08-05 NOTE — Progress Notes (Signed)
spiro

## 2020-08-05 NOTE — Telephone Encounter (Signed)
Spoke with the patient and he reports that he first noticed the hand/finger cramping back in May when he was in [place] visiting family.  It has only occurred a few times when his hand will be in an awkward position.  The cramping will last at most a few minutes.  He has not noticed a correlation with his Dupixent injections.  Discussed with the patient that arthralgia is a common side effect with Dupixent.  He reports that he is going to speak with his primary care physician about this tomorrow at his appointment.

## 2020-08-05 NOTE — Telephone Encounter (Signed)
noted 

## 2020-08-05 NOTE — Progress Notes (Signed)
[address] [place] [address] Dept: [phone]  FOLLOW UP NOTE  Patient ID: Grant Gonzalez, male    DOB: [birth year]  Age: 74 y.o. MRN: [id number] Date of Office Visit: 08/05/2020  Assessment  Chief Complaint: Medication Refill (Dupixent is going well. Wants to accept the refill for the next 90 days and wants to be sure that storing it for that long isn't going to harm him or the medication. Finger cramps are new and wants to see if that is a side affect of the injections.)  HPI Grant Gonzalez is a 74 year old male who presents today for follow-up of atopic dermatitis, mild intermittent asthma, and seasonal allergic rhinitis.  He was last seen on August 03, 2019 by Dr. Delorse Lek.  Atopic dermatitis is reported as controlled with Dupixent injections every 2 weeks.  He has not had to use [place] in "ages".  He currently uses what ever lotion is available during the wintertime.  He has noticed after his injection that for the next couple days his temperature goes hot and cold but does not have any fevers.  He mentions that in the past when he stopped Dupixent that his eczema flared, so he would like to continue these injections for now.  Mild intermittent asthma is reported as moderately controlled with albuterol.  He reports coughing at night that he feels is due to postnasal drip and a little bit of wheezing in the morning.  He denies any tightness in his chest, shortness of breath, and nocturnal awakenings.  He is currently taking montelukast that was prescribed by his primary care physician.  He has not used his albuterol in quite a while.  He denies any trips to the emergency room or urgent care due to breathing problems since his last visit.  He also denies any systemic steroids since his last office visit.  Seasonal allergic rhinitis is reported as moderately controlled with periodic rhinorrhea and nasal congestion and postnasal drip.  He periodically takes an over-the-counter  antihistamine and uses Flonase nasal spray as needed.  Discussed the use of azelastine nasal spray to see if it helps with the postnasal drip and he would like to hold off on sending this prescription for now.  Drug Allergies:  Allergies  Allergen Reactions  . Penicillins Nausea Only    Review of Systems: Review of Systems  Constitutional: Negative for chills and fever.  HENT:       Reports postnasal drip and periodic rhinorrhea and nasal congestion.  Eyes:       Reports occasional itchy eyes  Respiratory: Positive for wheezing. Negative for shortness of breath.        Reports cough at night that he feels is due to drainage and a little bit of wheezing.  Denies tightness in his chest, shortness of breath, and nocturnal awakenings.  Cardiovascular: Negative for chest pain and palpitations.  Gastrointestinal: Negative for abdominal pain and heartburn.  Genitourinary: Negative for dysuria.  Skin: Negative for itching and rash.  Neurological: Negative for headaches.  Endo/Heme/Allergies: Positive for environmental allergies.    Physical Exam: BP 140/80   Pulse (!) 110   Temp 98.5 F (36.9 C)   Resp 14   Ht 5' 2.75" (1.594 m)   Wt 157 lb 3.2 oz (71.3 kg)   SpO2 98%   BMI 28.07 kg/m    Physical Exam Constitutional:      Appearance: Normal appearance.  HENT:     Head: Normocephalic and atraumatic.  Comments: Pharynx normal.  Eyes normal.  Ears normal.  Nose bilateral lower turbinates mildly edematous with no drainage noted.    Right Ear: Tympanic membrane, ear canal and external ear normal.     Left Ear: Tympanic membrane, ear canal and external ear normal.     Mouth/Throat:     Mouth: Mucous membranes are moist.     Pharynx: Oropharynx is clear.  Eyes:     Conjunctiva/sclera: Conjunctivae normal.  Cardiovascular:     Rate and Rhythm: Regular rhythm.     Heart sounds: Normal heart sounds.  Pulmonary:     Effort: Pulmonary effort is normal.     Breath sounds: Normal  breath sounds.     Comments: Lungs clear to auscultation Musculoskeletal:     Cervical back: Neck supple.  Skin:    General: Skin is warm.     Comments: No eczematous lesions noted  Neurological:     Mental Status: He is alert and oriented to person, place, and time.  Psychiatric:        Mood and Affect: Mood normal.        Behavior: Behavior normal.        Thought Content: Thought content normal.     Diagnostics: FVC 3.24 L, FEV1 1.94 L.  Predicted FVC 3.05 L, FEV1 2.20 L.  Spirometry is consistent with previous spirometry.  Assessment and Plan: 1. Intrinsic atopic dermatitis   2. Mild intermittent asthma, uncomplicated   3. Seasonal allergic rhinitis due to other allergic trigger     No orders of the defined types were placed in this encounter.   Patient Instructions  Eczema (hand) Continue Dupixent injections every 2 weeks. Continue twice a day moisturization program Continue Eucrisa 2% ointment 1 application twice a day as needed to red itchy areas  Mild intermittent asthma Continue albuterol 2 puffs every 4 hours as needed for cough, wheeze, tightness in chest, or shortness of breath.  Also may use 2 puffs 5 to 15 minutes prior to exercise Continue Singulair as per your primary care physician  Seasonal allergic rhinitis May use an over-the-counter antihistamine once a day as needed for runny nose such as Claritin, Zyrtec, Allegra, or Xyzal Continue fluticasone nasal spray using 2 sprays each nostril once a day as needed for stuffy nose Consider azelastine nasal spray using 1 spray each nostril twice a day as needed for drainage. He will call our office if he would like this prescription sent  Please let us know if this treatment plan is not working well for you Schedule a follow-up appointment in 6-12 months   Return in about 1 year (around 08/05/2021), or if symptoms worsen or fail to improve.    Thank you for the opportunity to care for this patient.  Please  do not hesitate to contact me with questions.  Nehemiah Settle, FNP [facility]

## 2020-10-05 ENCOUNTER — Other Ambulatory Visit: Payer: Self-pay | Admitting: Allergy

## 2020-11-22 DIAGNOSIS — Z1389 Encounter for screening for other disorder: Secondary | ICD-10-CM | POA: Diagnosis not present

## 2020-11-22 DIAGNOSIS — Z0000 Encounter for general adult medical examination without abnormal findings: Secondary | ICD-10-CM | POA: Diagnosis not present

## 2020-11-22 DIAGNOSIS — I10 Essential (primary) hypertension: Secondary | ICD-10-CM | POA: Diagnosis not present

## 2020-11-22 DIAGNOSIS — J4520 Mild intermittent asthma, uncomplicated: Secondary | ICD-10-CM | POA: Diagnosis not present

## 2020-11-22 DIAGNOSIS — J309 Allergic rhinitis, unspecified: Secondary | ICD-10-CM | POA: Diagnosis not present

## 2020-11-22 DIAGNOSIS — R202 Paresthesia of skin: Secondary | ICD-10-CM | POA: Diagnosis not present

## 2020-11-22 DIAGNOSIS — Z1159 Encounter for screening for other viral diseases: Secondary | ICD-10-CM | POA: Diagnosis not present

## 2020-11-22 DIAGNOSIS — L309 Dermatitis, unspecified: Secondary | ICD-10-CM | POA: Diagnosis not present

## 2021-01-31 ENCOUNTER — Telehealth: Payer: Self-pay

## 2021-01-31 ENCOUNTER — Telehealth: Payer: Medicare Other

## 2021-01-31 DIAGNOSIS — L03115 Cellulitis of right lower limb: Secondary | ICD-10-CM | POA: Diagnosis not present

## 2021-01-31 NOTE — Telephone Encounter (Signed)
As been on Dupixent and he administers his shots at home. He missed his last shot by 3 days. He usually does them on sundays but he forgot and did it on wednesday. He got back on the schedule of during the injections on Sunday this week. He started to have swelling in his knee all the way down to his ankle. He is not sure if its related or if he was bitten by something, but it is hot to touch, red , and sore.  He took benadryl with no change. I did advise him to be seen at urgent care for his symptoms and to take pictures to upload through mychart. He will call back after being seen at urgent care is it possible for him to come in to the office so someone can take a look at his leg. Please advise.

## 2021-01-31 NOTE — Telephone Encounter (Signed)
I called the patient to see if he went to the urgent care for his leg. He stated that he did go to urgent care and the prescribed him an antibiotic. He is to take sulfamethoxazole 1 tablet twice a day for 7 - 10 day and follow up with his PCP Kirby Funk) with any worsening symptoms. He did not feel the need to upload pictures due to the issue being resolved at urgent care. He appreciated the concern.

## 2021-01-31 NOTE — Telephone Encounter (Signed)
Left detailed message on voicemail to inform patient of Dr. Randell Patient recommendations

## 2021-02-20 DIAGNOSIS — D510 Vitamin B12 deficiency anemia due to intrinsic factor deficiency: Secondary | ICD-10-CM | POA: Diagnosis not present

## 2021-02-20 DIAGNOSIS — J4520 Mild intermittent asthma, uncomplicated: Secondary | ICD-10-CM | POA: Diagnosis not present

## 2021-02-20 DIAGNOSIS — I10 Essential (primary) hypertension: Secondary | ICD-10-CM | POA: Diagnosis not present

## 2021-02-25 DIAGNOSIS — R053 Chronic cough: Secondary | ICD-10-CM | POA: Diagnosis not present

## 2021-02-25 DIAGNOSIS — I10 Essential (primary) hypertension: Secondary | ICD-10-CM | POA: Diagnosis not present

## 2021-02-25 DIAGNOSIS — R202 Paresthesia of skin: Secondary | ICD-10-CM | POA: Diagnosis not present

## 2021-04-22 ENCOUNTER — Other Ambulatory Visit: Payer: Self-pay

## 2021-04-22 ENCOUNTER — Other Ambulatory Visit (HOSPITAL_BASED_OUTPATIENT_CLINIC_OR_DEPARTMENT_OTHER): Payer: Self-pay

## 2021-04-22 ENCOUNTER — Ambulatory Visit: Payer: Medicare Other | Attending: Internal Medicine

## 2021-04-22 DIAGNOSIS — Z23 Encounter for immunization: Secondary | ICD-10-CM

## 2021-04-22 MED ORDER — PFIZER-BIONT COVID-19 VAC-TRIS 30 MCG/0.3ML IM SUSP
INTRAMUSCULAR | 0 refills | Status: DC
  Filled 2021-04-22: qty 0.3, 1d supply, fill #0

## 2021-04-22 NOTE — Progress Notes (Signed)
   Covid-19 Vaccination Clinic  Name:  Grant Gonzalez    MRN: [id number] DOB: 07-10-1946  04/22/2021  [name] was observed post Covid-19 immunization for 15 minutes without incident. He was provided with Vaccine Information Sheet and instruction to access the V-Safe system.   [name] was instructed to call 911 with any severe reactions post vaccine: Difficulty breathing  Swelling of face and throat  A fast heartbeat  A bad rash all over body  Dizziness and weakness   Immunizations Administered     Name Date Dose VIS Date Route   PFIZER Comrnaty(Gray TOP) Covid-19 Vaccine 04/22/2021  9:58 AM 0.3 mL 08/01/2020 Intramuscular   Manufacturer: [employer], [employer]   Lot: [id number]   NDC: [phone]

## 2021-07-30 NOTE — Patient Instructions (Addendum)
Eczema (hand) Continue Dupixent injections every 2 weeks. Continue twice a day moisturization program Continue Eucrisa 2% ointment 1 application twice a day as needed to red itchy areas  Mild intermittent asthma Start Flovent 110 mcg taking 2 puffs twice a day with spacer to help prevent cough and wheeze.  Spacer given along with demonstration. Continue albuterol 2 puffs every 4 hours as needed for cough, wheeze, tightness in chest, or shortness of breath.  Also may use 2 puffs 5 to 15 minutes prior to exercise  Seasonal allergic rhinitis May use an over-the-counter antihistamine once a day as needed for runny nose such as Claritin, Zyrtec, Allegra, or Xyzal Continue fluticasone nasal spray using 2 sprays each nostril once a day as needed for stuffy nose Consider azelastine nasal spray using 1 spray each nostril twice a day as needed for drainage. He will call our office if he would like this prescription sent  Please let us know if this treatment plan is not working well for you Schedule a follow-up appointment in 4-6 weeks sooner if needed

## 2021-07-31 ENCOUNTER — Ambulatory Visit: Payer: Medicare Other | Admitting: Family

## 2021-07-31 ENCOUNTER — Encounter: Payer: Self-pay | Admitting: Family

## 2021-07-31 ENCOUNTER — Other Ambulatory Visit: Payer: Self-pay

## 2021-07-31 VITALS — BP 154/70 | HR 110 | Temp 98.0°F | Resp 19 | Ht 62.0 in | Wt 165.4 lb

## 2021-07-31 DIAGNOSIS — J3089 Other allergic rhinitis: Secondary | ICD-10-CM

## 2021-07-31 DIAGNOSIS — J45998 Other asthma: Secondary | ICD-10-CM | POA: Diagnosis not present

## 2021-07-31 DIAGNOSIS — J45909 Unspecified asthma, uncomplicated: Secondary | ICD-10-CM | POA: Diagnosis not present

## 2021-07-31 DIAGNOSIS — J4520 Mild intermittent asthma, uncomplicated: Secondary | ICD-10-CM | POA: Diagnosis not present

## 2021-07-31 DIAGNOSIS — L2084 Intrinsic (allergic) eczema: Secondary | ICD-10-CM | POA: Diagnosis not present

## 2021-07-31 MED ORDER — FLUTICASONE PROPIONATE HFA 110 MCG/ACT IN AERO: INHALATION_SPRAY | RESPIRATORY_TRACT | 1 refills | Status: DC

## 2021-07-31 NOTE — Progress Notes (Signed)
[address] [place] [address] Dept: [phone]  FOLLOW UP NOTE  Patient ID: Grant Gonzalez, male    DOB: 03/18/1946  Age: 75 y.o. MRN: [id number] Date of Office Visit: 07/31/2021  Assessment  Chief Complaint: Follow-up (Pt states he present for medication refill, he reports that he have been doing well. Pt have a cough that he claims he always had, with post nasal drainage, sinus congestion, he report.) and Medication Refill  HPI Grant Gonzalez is a 75 year old male who presents today for follow-up of intrinsic atopic dermatitis, mild intermittent asthma, and seasonal allergic rhinitis.  He was last seen on August 05, 2020.  Since his last office visit he denies any new diagnoses or surgeries.  Atopic dermatitis is reported as well controlled with Dupixent injections every 2 weeks and moisturization.  He reports that he has not had to use Eucrisa 2% ointment in ages.  He reports that his last Dupixent injection was this past Sunday.  He does notice that after he gets his Dupixent injections that he has sweating more than normal for the next 3 to 4 days.  He reports that this is not major.  He reports during the wintertime he will get dry spots on his hand to where he will use a thick smooth lotion.  He does feel like Dupixent has really helped his atopic dermatitis.  He does mention that he still gets occasional cramps in his right hand but he does not notice a correlation with the cramps and the Dupixent injections.  He also reports that he does not feel like the cramping is due to Dupixent injections.  Mild intermittent asthma is reported as moderately controlled with albuterol as needed.  He mentions that his primary care physician had him stop Singulair.  He reports constant coughing that he feels is due to postnasal drip.  He reports that he has some rare wheezing and denies tightness in his chest and shortness of breath.  He also mentions he does have an occasional cough at night.   Since his last office visit he has not required any systemic steroids or made any trips to the emergency room or urgent care due to breathing problems.  He has not used albuterol in forever.  He does mention that his primary care physician stopped his lisinopril 5 to 6 months ago and his cough is somewhat better.  Seasonal allergic rhinitis is reported as moderately controlled with an over-the-counter antihistamine and fluticasone nasal spray as needed.  He reports postnasal drip and occasional rhinorrhea and nasal congestion.  He does not feel like his postnasal drip is bothersome enough to try azelastine nasal spray.  He also reports that this time of the year his nose is dry and he will use saline nasal gel.  When his nose is dry he will at times have nosebleeds.   Drug Allergies:  Allergies  Allergen Reactions   Penicillins Nausea Only    Review of Systems: Review of Systems  Constitutional:  Negative for chills and fever.  HENT:         Reports postnasal drip and occasional rhinorrhea and nasal congestion.  Eyes:        Reports dry eyes and denies itchy eyes  Respiratory:  Positive for cough and wheezing. Negative for shortness of breath.   Cardiovascular:  Negative for chest pain and palpitations.  Gastrointestinal:        Denies heartburn or reflux symptoms  Genitourinary:  Negative for frequency.  Skin:  Negative for itching and rash.  Neurological:  Negative for headaches.    Physical Exam: BP (!) 154/70   Pulse (!) 110   Temp 98 F (36.7 C) (Temporal)   Resp 19   Ht 5\' 2"  (1.575 m)   Wt 165 lb 6.4 oz (75 kg)   SpO2 99%   BMI 30.25 kg/m    Physical Exam Constitutional:      Appearance: Normal appearance.  HENT:     Head: Normocephalic and atraumatic.     Comments: Pharynx normal, eyes normal, ears normal, nose bilateral lower turbinates mildly edematous and slightly erythematous with no drainage noted.    Right Ear: Tympanic membrane, ear canal and external ear  normal.     Left Ear: Tympanic membrane, ear canal and external ear normal.     Mouth/Throat:     Mouth: Mucous membranes are moist.     Pharynx: Oropharynx is clear.  Eyes:     Conjunctiva/sclera: Conjunctivae normal.  Cardiovascular:     Rate and Rhythm: Regular rhythm.     Heart sounds: Normal heart sounds.  Pulmonary:     Effort: Pulmonary effort is normal.     Breath sounds: Normal breath sounds.     Comments: Lungs clear to auscultation Musculoskeletal:     Cervical back: Neck supple.  Skin:    General: Skin is warm.  Neurological:     Mental Status: He is alert and oriented to person, place, and time.  Psychiatric:        Mood and Affect: Mood normal.        Behavior: Behavior normal.        Thought Content: Thought content normal.        Judgment: Judgment normal.    Diagnostics: FVC 2.86 L, FEV1 1.61 L.  Predicted FVC 3.07 L, predicted FEV1 2.36 L.  Spirometry indicates possible moderate obstruction.  Postbronchodilator response shows FVC 2.90 L, FEV1 1.88 L.  There is a 17% change in FEV1.  Spirometry indicates normal respiratory function.  Assessment and Plan: 1. Mild intermittent asthma, uncomplicated   2. Intrinsic atopic dermatitis   3. Seasonal allergic rhinitis due to other allergic trigger     Meds ordered this encounter  Medications   fluticasone (FLOVENT HFA) 110 MCG/ACT inhaler    Sig: Inhale 2 puffs twice a day with spacer to help prevent cough and wheeze.  Rinse mouth out afterwards.    Dispense:  1 each    Refill:  1     Patient Instructions  Eczema (hand) Continue Dupixent injections every 2 weeks. Continue twice a day moisturization program Continue Eucrisa 2% ointment 1 application twice a day as needed to red itchy areas  Mild intermittent asthma Start Flovent 110 mcg taking 2 puffs twice a day with spacer to help prevent cough and wheeze.  Spacer given along with demonstration. Continue albuterol 2 puffs every 4 hours as needed for  cough, wheeze, tightness in chest, or shortness of breath.  Also may use 2 puffs 5 to 15 minutes prior to exercise  Seasonal allergic rhinitis May use an over-the-counter antihistamine once a day as needed for runny nose such as Claritin, Zyrtec, Allegra, or Xyzal Continue fluticasone nasal spray using 2 sprays each nostril once a day as needed for stuffy nose Consider azelastine nasal spray using 1 spray each nostril twice a day as needed for drainage. He will call our office if he would like this prescription sent  Please let  know if this treatment plan is not working well for you Schedule a follow-up appointment in 4-6 weeks sooner if needed Return in about 6 weeks (around 09/11/2021), or if symptoms worsen or fail to improve.    Thank you for the opportunity to care for this patient.  Please do not hesitate to contact me with questions.  Nehemiah Settle, FNP [facility]

## 2021-08-06 ENCOUNTER — Ambulatory Visit: Payer: Medicare Other | Attending: Internal Medicine

## 2021-08-06 ENCOUNTER — Other Ambulatory Visit (HOSPITAL_BASED_OUTPATIENT_CLINIC_OR_DEPARTMENT_OTHER): Payer: Self-pay

## 2021-08-06 DIAGNOSIS — Z23 Encounter for immunization: Secondary | ICD-10-CM

## 2021-08-06 MED ORDER — PFIZER COVID-19 VAC BIVALENT 30 MCG/0.3ML IM SUSP
INTRAMUSCULAR | 0 refills | Status: DC
  Filled 2021-08-06: qty 0.3, 1d supply, fill #0

## 2021-08-06 NOTE — Progress Notes (Signed)
° °  Covid-19 Vaccination Clinic  Name:  Grant Gonzalez    MRN: [id number] DOB: 1946-06-20  08/06/2021  Mr. Bitter was observed post Covid-19 immunization for 15 minutes without incident. He was provided with Vaccine Information Sheet and instruction to access the V-Safe system.   Mr. Bartolucci was instructed to call 911 with any severe reactions post vaccine: Difficulty breathing  Swelling of face and throat  A fast heartbeat  A bad rash all over body  Dizziness and weakness   Immunizations Administered     Name Date Dose VIS Date Route   Pfizer Covid-19 Vaccine Bivalent Booster 08/06/2021 10:57 AM 0.3 mL 04/23/2021 Intramuscular   Manufacturer: [employer], [employer]   Lot: [id number]   NDC: [phone]

## 2021-09-04 ENCOUNTER — Ambulatory Visit: Payer: Medicare Other | Admitting: Family

## 2021-09-07 NOTE — Patient Instructions (Addendum)
Eczema (hand) Continue Dupixent injections every 2 weeks. Continue twice a day moisturization program Continue Eucrisa 2% ointment 1 application twice a day as needed to red itchy areas  Mild intermittent asthma Continue  Flovent 110 mcg taking 2 puffs twice a day with spacer to help prevent cough and wheeze.  Spacer given along with demonstration. Continue albuterol 2 puffs every 4 hours as needed for cough, wheeze, tightness in chest, or shortness of breath.  Also may use 2 puffs 5 to 15 minutes prior to exercise  Seasonal allergic rhinitis May use an over-the-counter antihistamine once a day as needed for runny nose such as Claritin, Zyrtec, Allegra, or Xyzal Continue fluticasone nasal spray using 2 sprays each nostril once a day as needed for stuffy nose Consider azelastine nasal spray using 1 spray each nostril twice a day as needed for drainage. He will call our office if he would like this prescription sent. This medication is now also over the counter  Please let us know if this treatment plan is not working well for you Schedule a follow-up appointment in 6 months or sooner if needed

## 2021-09-08 ENCOUNTER — Ambulatory Visit (INDEPENDENT_AMBULATORY_CARE_PROVIDER_SITE_OTHER): Payer: Medicare Other | Admitting: Family

## 2021-09-08 ENCOUNTER — Encounter: Payer: Self-pay | Admitting: Family

## 2021-09-08 ENCOUNTER — Other Ambulatory Visit: Payer: Self-pay

## 2021-09-08 VITALS — BP 132/86 | HR 86 | Temp 97.5°F | Resp 18 | Ht 62.0 in | Wt 164.0 lb

## 2021-09-08 DIAGNOSIS — L2084 Intrinsic (allergic) eczema: Secondary | ICD-10-CM | POA: Diagnosis not present

## 2021-09-08 DIAGNOSIS — J4520 Mild intermittent asthma, uncomplicated: Secondary | ICD-10-CM

## 2021-09-08 DIAGNOSIS — J3089 Other allergic rhinitis: Secondary | ICD-10-CM

## 2021-09-08 MED ORDER — FLUTICASONE PROPIONATE HFA 110 MCG/ACT IN AERO: INHALATION_SPRAY | RESPIRATORY_TRACT | 5 refills | Status: DC

## 2021-09-08 NOTE — Progress Notes (Signed)
[place] [address] Dept: [phone]  FOLLOW UP NOTE  Patient ID: Grant Gonzalez, male    DOB: [birth year]  Age: 76 y.o. MRN: [id number] Date of Office Visit: 09/08/2021  Assessment  Chief Complaint: Allergic Rhinitis  (Dupixent renewal ) and Other (January 2nd had covid - mostly coughing )  HPI Grant Gonzalez is a 76 year old male who presents today for follow-up of mild intermittent asthma, intrinsic atopic dermatitis, and seasonal allergic rhinitis.  He was last seen on July 31, 2021 by Althea Charon, FNP.  Since his last office visit he reports that he was diagnosed with COVID-19 the first week of January 2023.  He did not require antiviral medication.  Atopic dermatitis is reported as controlled with Dupixent injections every 2 weeks, Eucrisa as needed, and lotion.  He reports that he just had his Dupixent injection yesterday.  He denies any problems with his Dupixent injections and does feel like they have helped.  He has not really needed to use Eucrisa 2% ointment.  Mild intermittent asthma is reported as moderately controlled with Flovent 110 mcg 2 puffs twice a day with spacer and albuterol as needed.  He reports that when he had COVID he had a cough that was productive with green sputum.  Now he has a cough that can be dry at times and productive at times. When the cough is productive it is clear in color. He reports a little bit of wheezing and a little bit of shortness of breath.  He denies fever, chills, tightness in his chest, and nocturnal awakenings due to breathing problems.  Since his last office visit he has not required any systemic steroids and has not made any trips to the emergency room or urgent care due to breathing problems.  He has not used his albuterol inhaler in months.  He reports that his breathing is better and he feels like his cough is due to drainage and that it is typical for him to have this cough this time a year.  Seasonal  allergic rhinitis is reported as moderately controlled with an over-the-counter antihistamine and fluticasone nasal spray as needed.  He reports clear rhinorrhea and very little postnasal drip.  He denies nasal congestion.  He has not had any sinus infections since we last saw him.  He reports that he does not like to take medicines and is not interested in trying azelastine nasal spray.   Drug Allergies:  Allergies  Allergen Reactions   Penicillins Nausea Only    Review of Systems: Review of Systems  Constitutional:  Negative for chills and fever.  HENT:         Reports clear rhinorrhea and very little postnasal drip.  Denies nasal congestion  Eyes:        Reports watery eyes and denies itchy eyes  Respiratory:  Positive for cough, shortness of breath and wheezing.        Reports a cough that can be dry and productive.  What he coughs up is clear in color.  He reports a little bit of wheezing and a little bit of shortness of breath.  Denies tightness in chest and nocturnal awakenings due to breathing problems.  Cardiovascular:  Negative for chest pain and palpitations.  Gastrointestinal:        Denies heartburn or reflux symptoms  Genitourinary:  Negative for frequency.  Skin:  Negative for itching and rash.  Neurological:  Negative for headaches.  Endo/Heme/Allergies:  Positive for environmental allergies.  Physical Exam: BP 132/86    Pulse 86    Temp (!) 97.5 F (36.4 C)    Resp 18    Ht 5\' 2"  (1.575 m)    Wt 164 lb (74.4 kg)    SpO2 99%    BMI 30.00 kg/m    Physical Exam Constitutional:      Appearance: Normal appearance.  HENT:     Head: Normocephalic and atraumatic.     Comments: Pharynx normal, eyes normal, ears normal, nose: Bilateral lower turbinates moderately edematous with whitish drainage noted    Right Ear: Tympanic membrane, ear canal and external ear normal.     Left Ear: Tympanic membrane, ear canal and external ear normal.     Mouth/Throat:     Mouth:  Mucous membranes are moist.     Pharynx: Oropharynx is clear.  Eyes:     Conjunctiva/sclera: Conjunctivae normal.  Cardiovascular:     Rate and Rhythm: Regular rhythm.     Heart sounds: Normal heart sounds.  Pulmonary:     Effort: Pulmonary effort is normal.     Breath sounds: Normal breath sounds.     Comments: Lungs clear to auscultation Musculoskeletal:     Cervical back: Neck supple.  Skin:    General: Skin is warm.     Comments: No eczematous lesions noted  Neurological:     Mental Status: He is alert and oriented to person, place, and time.  Psychiatric:        Mood and Affect: Mood normal.        Behavior: Behavior normal.        Thought Content: Thought content normal.        Judgment: Judgment normal.    Diagnostics: FVC 3.47 L, FEV1 1.64 L.  Predicted FVC 3.07 L, predicted FEV1 2.36 L.  Spirometry indicates normal respiratory function.  Assessment and Plan: 1. Mild intermittent asthma, uncomplicated   2. Intrinsic atopic dermatitis   3. Seasonal allergic rhinitis due to other allergic trigger     Meds ordered this encounter  Medications   fluticasone (FLOVENT HFA) 110 MCG/ACT inhaler    Sig: Inhale 2 puffs twice a day with spacer to help prevent cough and wheeze.  Rinse mouth out afterwards.    Dispense:  12 g    Refill:  5    Patient Instructions  Eczema (hand) Continue Dupixent injections every 2 weeks. Continue twice a day moisturization program Continue Eucrisa 2% ointment 1 application twice a day as needed to red itchy areas  Mild intermittent asthma Continue  Flovent 110 mcg taking 2 puffs twice a day with spacer to help prevent cough and wheeze.  Spacer given along with demonstration. Continue albuterol 2 puffs every 4 hours as needed for cough, wheeze, tightness in chest, or shortness of breath.  Also may use 2 puffs 5 to 15 minutes prior to exercise  Seasonal allergic rhinitis May use an over-the-counter antihistamine once a day as needed for  runny nose such as Claritin, Zyrtec, Allegra, or Xyzal Continue fluticasone nasal spray using 2 sprays each nostril once a day as needed for stuffy nose Consider azelastine nasal spray using 1 spray each nostril twice a day as needed for drainage. He will call our office if he would like this prescription sent. This medication is now also over the counter  Please let us know if this treatment plan is not working well for you Schedule a follow-up appointment in 6 months or sooner if needed  Return in about 6 months (around 03/08/2022), or if symptoms worsen or fail to improve.    Thank you for the opportunity to care for this patient.  Please do not hesitate to contact me with questions.  Althea Charon, FNP [facility]

## 2021-10-21 ENCOUNTER — Other Ambulatory Visit: Payer: Self-pay | Admitting: Allergy

## 2021-11-25 DIAGNOSIS — Z136 Encounter for screening for cardiovascular disorders: Secondary | ICD-10-CM | POA: Diagnosis not present

## 2021-11-25 DIAGNOSIS — Z0000 Encounter for general adult medical examination without abnormal findings: Secondary | ICD-10-CM | POA: Diagnosis not present

## 2021-11-25 DIAGNOSIS — I739 Peripheral vascular disease, unspecified: Secondary | ICD-10-CM | POA: Diagnosis not present

## 2021-11-25 DIAGNOSIS — I10 Essential (primary) hypertension: Secondary | ICD-10-CM | POA: Diagnosis not present

## 2021-11-25 DIAGNOSIS — Z1389 Encounter for screening for other disorder: Secondary | ICD-10-CM | POA: Diagnosis not present

## 2021-11-25 DIAGNOSIS — D510 Vitamin B12 deficiency anemia due to intrinsic factor deficiency: Secondary | ICD-10-CM | POA: Diagnosis not present

## 2022-03-19 ENCOUNTER — Ambulatory Visit: Payer: Medicare Other | Admitting: Allergy

## 2022-05-21 DIAGNOSIS — J309 Allergic rhinitis, unspecified: Secondary | ICD-10-CM | POA: Diagnosis not present

## 2022-05-21 DIAGNOSIS — Z23 Encounter for immunization: Secondary | ICD-10-CM | POA: Diagnosis not present

## 2022-05-21 DIAGNOSIS — J4520 Mild intermittent asthma, uncomplicated: Secondary | ICD-10-CM | POA: Diagnosis not present

## 2022-05-21 DIAGNOSIS — N644 Mastodynia: Secondary | ICD-10-CM | POA: Diagnosis not present

## 2022-05-21 DIAGNOSIS — I10 Essential (primary) hypertension: Secondary | ICD-10-CM | POA: Diagnosis not present

## 2022-05-21 DIAGNOSIS — R059 Cough, unspecified: Secondary | ICD-10-CM | POA: Diagnosis not present

## 2022-07-29 ENCOUNTER — Other Ambulatory Visit (HOSPITAL_BASED_OUTPATIENT_CLINIC_OR_DEPARTMENT_OTHER): Payer: Self-pay

## 2022-07-29 MED ORDER — COMIRNATY 30 MCG/0.3ML IM SUSY
PREFILLED_SYRINGE | INTRAMUSCULAR | 0 refills | Status: DC
  Filled 2022-07-29: qty 0.3, 1d supply, fill #0

## 2022-09-08 ENCOUNTER — Other Ambulatory Visit: Payer: Self-pay | Admitting: Allergy

## 2022-09-18 ENCOUNTER — Telehealth: Payer: Self-pay | Admitting: *Deleted

## 2022-09-18 NOTE — Progress Notes (Signed)
  Care Coordination   Note   09/18/2022 Name: Grant Gonzalez MRN: [id number] DOB: 1946-03-07  [name] is a 77 y.o. year old male who sees Lavone Orn, MD for primary care. I reached out to [name] by phone today to offer care coordination services.  [name] was given information about Care Coordination services today including:   The Care Coordination services include support from the care team which includes your Nurse Coordinator, Clinical Social Worker, or Pharmacist.  The Care Coordination team is here to help remove barriers to the health concerns and goals most important to you. Care Coordination services are voluntary, and the patient may decline or stop services at any time by request to their care team member.   Care Coordination Consent Status: Patient agreed to services and verbal consent obtained.   Follow up plan:  Telephone appointment with care coordination team member scheduled for:  09/23/22  Encounter Outcome:  Pt. Scheduled  [facility]  Direct Dial: [phone]

## 2022-09-18 NOTE — Progress Notes (Signed)
  Care Coordination  Outreach Note  09/18/2022 Name: Grant Gonzalez MRN: [id number] DOB: [birth year]   Care Coordination Outreach Attempts: An unsuccessful telephone outreach was attempted today to offer the patient information about available care coordination services as a benefit of their health plan.   Follow Up Plan:  Additional outreach attempts will be made to offer the patient care coordination information and services.   Encounter Outcome:  No Answer  [place]  Direct Dial: [phone]

## 2022-09-21 ENCOUNTER — Ambulatory Visit: Payer: Self-pay

## 2022-09-21 DIAGNOSIS — I10 Essential (primary) hypertension: Secondary | ICD-10-CM | POA: Insufficient documentation

## 2022-09-21 DIAGNOSIS — J4520 Mild intermittent asthma, uncomplicated: Secondary | ICD-10-CM | POA: Insufficient documentation

## 2022-09-21 NOTE — Patient Outreach (Signed)
  Care Coordination   Initial Visit Note   09/21/2022 Name: Stratton Villwock MRN: [id number] DOB: [birth year]  Marcellas Marchant is a 77 y.o. year old male who sees Schwartzman, Laurian Brim, MD for primary care. I spoke with  [name] by phone today.  What matters to the patients health and wellness today?  My biggest problem is the cost of my Dupixent has gone up to $4500.00.  States he is also looking for a lower cost alternative to his Flovent inhaler since they no longer make it.      Goals Addressed             This Visit's Progress    Care Coordination Activities-self management of asthma       Care Coordination Interventions: Provided patient with basic written and verbal Asthma education on self care/management/and exacerbation prevention Provided instruction about proper use of medications used for management of Asthma including inhalers Assessed social determinant of health barriers  Evaluation of current treatment plan related to asthma and patient's adherence to plan as established by provider Reviewed medications with patient and discussed cost and adherence.  Pharmacy referral for cost of [place] and lower cost replacement for Flovent.  Referral to [facility]  pharmacist at providers office          SDOH assessments and interventions completed:  Yes  SDOH Interventions Today    Flowsheet Row Most Recent Value  SDOH Interventions   Food Insecurity Interventions Intervention Not Indicated  Housing Interventions Intervention Not Indicated  Transportation Interventions Intervention Not Indicated  Utilities Interventions Intervention Not Indicated  Physical Activity Interventions Intervention Not Indicated        Care Coordination Interventions:  Yes, provided   Follow up plan: Referral made to [facility]ist at [facility] Follow up call scheduled for 10/20/22    Encounter Outcome:  Pt. Visit Completed

## 2022-09-21 NOTE — Patient Instructions (Signed)
Visit Information  Thank you for taking time to visit with me today. Please don't hesitate to contact me if I can be of assistance to you.   Following are the goals we discussed today:   Goals Addressed             This Visit's Progress    Care Coordination Activities-self management of asthma       Care Coordination Interventions: Provided patient with basic written and verbal Asthma education on self care/management/and exacerbation prevention Provided instruction about proper use of medications used for management of Asthma including inhalers Assessed social determinant of health barriers  Evaluation of current treatment plan related to asthma and patient's adherence to plan as established by provider Reviewed medications with patient and discussed cost and adherence.  Pharmacy referral for cost of [place] and lower cost replacement for Flovent.  Referral to [facility]  pharmacist at providers office          Our next appointment is by telephone on 10/20/22 at [facility]  Please call the care guide team at [phone] if you need to cancel or reschedule your appointment.   If you are experiencing a Mental Health or [place] or need someone to talk to, please call the Suicide and Crisis Lifeline: 988 call the [place] National Suicide Prevention Lifeline: (607)621-4261 or TTY: (213) 444-7170 TTY 602-711-9635) to talk to a trained counselor call 1-800-273-TALK (toll free, 24 hour hotline) go to [facility] [address] [place] [phone]) call 911   Patient verbalizes understanding of instructions and care plan provided today and agrees to view in [place]. Active MyChart status and patient understanding of how to access instructions and care plan via MyChart confirmed with patient.     Telephone follow up appointment with care management team member scheduled for: 10/20/22 Peter Garter RN, Jackquline [place], [facility] [phone]

## 2022-10-20 ENCOUNTER — Ambulatory Visit: Payer: Self-pay

## 2022-10-20 NOTE — Patient Outreach (Signed)
  Care Coordination   Follow Up Visit Note   10/20/2022 Name: Grant Gonzalez MRN: [id number] DOB: [birth year]  [name] is a 77 y.o. year old male who sees Schwartzman, Laurian Brim, MD for primary care. I spoke with  [name] by phone today.  What matters to the patients health and wellness today?  I have been doing good States  he has worked with the Clinical research associate to get Qvar and he is working with his allergist office on dosing his [place].  Denies any issues with his asthma or atopic dermatitis at this time.  No further concerns at this time    Goals Addressed             This Visit's Progress    COMPLETED: Care Coordination Activities-self management of asthma       Care Coordination Interventions: Advised patient to track and manage Asthma triggers Provided instruction about proper use of medications used for management of Asthma including inhalers Advised patient to self assesses Asthma action plan zone and make appointment with provider if in the yellow zone for 48 hours without improvement Evaluation of current treatment plan related to asthma and patient's adherence to plan as established by provider Pharmacy referral for cost of Dupixent and lower cost replacement for Flovent.  Referral to [facility]  pharmacist at providers office .  Pt has worked with Clinical research associate to get substitution for [employer] and is affording Dupixent at this time No further care coordination needs at this time  Interventions Today    Flowsheet Row Most Recent Value  Chronic Disease   Chronic disease during today's visit Other  [asthma]  General Interventions   General Interventions Discussed/Reviewed General Interventions Reviewed, Doctor Visits  Doctor Visits Discussed/Reviewed Doctor Visits Discussed, Annual Wellness Visits  Education Interventions   Education Provided Provided Education  Provided Verbal Education On When to see the doctor, Medication, Other  [asthma and when to  call MD]  Pharmacy Interventions   Pharmacy Dicussed/Reviewed Pharmacy Topics Reviewed, Affording Medications  [working with [facility] pharmacist]              SDOH assessments and interventions completed:  No     Care Coordination Interventions:  Yes, provided  Interventions Today    Flowsheet Row Most Recent Value  Chronic Disease   Chronic disease during today's visit Other  [asthma]  General Interventions   General Interventions Discussed/Reviewed General Interventions Reviewed, Doctor Visits  Doctor Visits Discussed/Reviewed Doctor Visits Discussed, Annual Wellness Visits  Education Interventions   Education Provided Provided Education  Provided Verbal Education On When to see the doctor, Medication, Other  [asthma and when to call MD]  Pharmacy Interventions   Pharmacy Dicussed/Reviewed Pharmacy Topics Reviewed, Affording Medications  [working with [facility] pharmacist]       Follow up plan: No further intervention required.   Encounter Outcome:  Pt. Visit Completed  Peter Garter RN, BSN,CCM, CDE Care Management Coordinator [facility] [phone]

## 2022-10-20 NOTE — Patient Instructions (Signed)
Visit Information  Thank you for taking time to visit with me today. Please don't hesitate to contact me if I can be of assistance to you.   Following are the goals we discussed today:   Goals Addressed             This Visit's Progress    COMPLETED: Care Coordination Activities-self management of asthma       Care Coordination Interventions: Advised patient to track and manage Asthma triggers Provided instruction about proper use of medications used for management of Asthma including inhalers Advised patient to self assesses Asthma action plan zone and make appointment with provider if in the yellow zone for 48 hours without improvement Evaluation of current treatment plan related to asthma and patient's adherence to plan as established by provider Pharmacy referral for cost of Dupixent and lower cost replacement for Flovent.  Referral to [facility]  pharmacist at providers office .  Pt has worked with Clinical research associate to get substitution for [employer] and is affording Dupixent at this time No further care coordination needs at this time  Interventions Today    Flowsheet Row Most Recent Value  Chronic Disease   Chronic disease during today's visit Other  [asthma]  General Interventions   General Interventions Discussed/Reviewed General Interventions Reviewed, Doctor Visits  Doctor Visits Discussed/Reviewed Doctor Visits Discussed, Annual Wellness Visits  Education Interventions   Education Provided Provided Education  Provided Verbal Education On When to see the doctor, Medication, Other  [asthma and when to call MD]  Pharmacy Interventions   Pharmacy Dicussed/Reviewed Pharmacy Topics Reviewed, Affording Medications  [working with [facility] pharmacist]               If you are experiencing a Mental Health or [place] or need someone to talk to, please call the Suicide and Crisis Lifeline: 988 call the [place] National Suicide Prevention Lifeline: 903-411-2443  or TTY: (925)731-4296 TTY 203-019-2635) to talk to a trained counselor call 1-800-273-TALK (toll free, 24 hour hotline) go to [facility] [phone]) call 911   Patient verbalizes understanding of instructions and care plan provided today and agrees to view in [place]. Active MyChart status and patient understanding of how to access instructions and care plan via MyChart confirmed with patient.     No further follow up required:    [place], Jackquline [place], [place] [facility] [phone]

## 2023-02-16 DIAGNOSIS — I10 Essential (primary) hypertension: Secondary | ICD-10-CM | POA: Diagnosis not present

## 2023-02-16 DIAGNOSIS — J4520 Mild intermittent asthma, uncomplicated: Secondary | ICD-10-CM | POA: Diagnosis not present

## 2023-02-16 DIAGNOSIS — J309 Allergic rhinitis, unspecified: Secondary | ICD-10-CM | POA: Diagnosis not present

## 2023-02-16 DIAGNOSIS — R202 Paresthesia of skin: Secondary | ICD-10-CM | POA: Diagnosis not present

## 2023-02-16 DIAGNOSIS — R053 Chronic cough: Secondary | ICD-10-CM | POA: Diagnosis not present

## 2023-02-16 DIAGNOSIS — L309 Dermatitis, unspecified: Secondary | ICD-10-CM | POA: Diagnosis not present

## 2023-02-16 DIAGNOSIS — Z0000 Encounter for general adult medical examination without abnormal findings: Secondary | ICD-10-CM | POA: Diagnosis not present

## 2023-02-16 DIAGNOSIS — I739 Peripheral vascular disease, unspecified: Secondary | ICD-10-CM | POA: Diagnosis not present

## 2023-02-16 DIAGNOSIS — Z23 Encounter for immunization: Secondary | ICD-10-CM | POA: Diagnosis not present

## 2023-02-16 DIAGNOSIS — E538 Deficiency of other specified B group vitamins: Secondary | ICD-10-CM | POA: Diagnosis not present

## 2023-02-16 DIAGNOSIS — Z1211 Encounter for screening for malignant neoplasm of colon: Secondary | ICD-10-CM | POA: Diagnosis not present

## 2023-03-08 ENCOUNTER — Ambulatory Visit: Payer: Medicare Other | Admitting: Internal Medicine

## 2023-03-08 ENCOUNTER — Encounter: Payer: Self-pay | Admitting: Internal Medicine

## 2023-03-08 ENCOUNTER — Other Ambulatory Visit: Payer: Self-pay

## 2023-03-08 VITALS — BP 140/82 | HR 99 | Temp 98.4°F | Resp 16 | Ht 63.5 in | Wt 165.6 lb

## 2023-03-08 DIAGNOSIS — R053 Chronic cough: Secondary | ICD-10-CM | POA: Diagnosis not present

## 2023-03-08 DIAGNOSIS — J309 Allergic rhinitis, unspecified: Secondary | ICD-10-CM | POA: Insufficient documentation

## 2023-03-08 DIAGNOSIS — J3089 Other allergic rhinitis: Secondary | ICD-10-CM

## 2023-03-08 DIAGNOSIS — J4520 Mild intermittent asthma, uncomplicated: Secondary | ICD-10-CM

## 2023-03-08 DIAGNOSIS — L2084 Intrinsic (allergic) eczema: Secondary | ICD-10-CM | POA: Diagnosis not present

## 2023-03-08 MED ORDER — IPRATROPIUM BROMIDE 0.03 % NA SOLN: 2.0000 | Freq: Two times a day (BID) | NASAL | 5 refills | Status: DC | PRN

## 2023-03-08 MED ORDER — BUDESONIDE-FORMOTEROL FUMARATE 80-4.5 MCG/ACT IN AERO: 2.0000 | INHALATION_SPRAY | Freq: Two times a day (BID) | RESPIRATORY_TRACT | 5 refills | Status: DC

## 2023-03-08 NOTE — Progress Notes (Signed)
FOLLOW UP Date of Service/Encounter:  03/08/23   Subjective:  Grant Gonzalez (DOB: Apr 30, 1946) is a 77 y.o. 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  History obtained from: chart review and patient.  For Review, LV was on 09/08/22  with Nehemiah Settle, FNP seen for routine follow-up. See below for summary of history and diagnostics.  This is my first encounter with this patient. Therapeutic plans/changes recommended: Fev1 1.64L, asthma partially controlled, spacer given. Eczema controlled on dupixent. Had recently had covid-19 infection and was recovering. --------------------------------------------------- Today presents for follow-up. Asthma: Not doing well. He has constant cough and phlegm. His cough is dry and ongoing for 2 months. The heat is also bothering him. Has had some SOB but nothing major. He was on Flovent which was helpful, but insurance wouldn't cover it. He did try QVAR redihaler. However, he did not refill it at the end of June due to it not being helpful. He still has some flovent at home, and would like to restart this medications.  He is on dupixent, he does home injections every 4 weeks for eczema. He has spaced out his dupixent to once a month over time and his eczema is controlled on this regimen.He is unsure if spacing out has made his asthma less controlled. He does go to the gym at least 3 times per week. He doesn't seem to have any exercise intolerance.  Coughing and chest tightness is not keeping him up from sleep at night.  He does have a lot phlegm going down the back of his throat. He has not had any recent antibiotics. He takes flonase and has used for many years. He is also taking xyzal daily.  Allergies as of 03/08/2023       Reactions   Penicillins Nausea Only        Medication List         Accurate as of March 08, 2023  4:21 PM. If you have any questions, ask your nurse or doctor.          STOP taking these medications    fluticasone 110 MCG/ACT inhaler Commonly known as: Flovent HFA Stopped by: Verlee Monte   montelukast 10 MG tablet Commonly known as: SINGULAIR Stopped by: Verlee Monte   Qvar RediHaler 40 MCG/ACT inhaler Generic drug: beclomethasone Stopped by: Verlee Monte       TAKE these medications    Besivance 0.6 % Susp Generic drug: Besifloxacin HCl Place 1 drop into the left eye 3 (three) times daily.   budesonide-formoterol 80-4.5 MCG/ACT inhaler Commonly known as: Symbicort Inhale 2 puffs into the lungs in the morning and at bedtime. Started by: Verlee Monte   chlorthalidone 25 MG tablet Commonly known as: HYGROTON Take 25 mg by mouth daily.   chlorthalidone 25 MG tablet Commonly known as: HYGROTON Take 25 mg by mouth daily.   cyanocobalamin 1000 MCG tablet Commonly known as: VITAMIN B12 Take 1,000 mcg by mouth daily.   Dupixent 300 MG/2ML prefilled syringe Generic drug: dupilumab INJECT 300MG  SUBCUTANEOUSLY  EVERY OTHER WEEK   Durezol 0.05 % Emul Generic drug: Difluprednate Place 1 drop into the right eye 3 (three) times daily.   fluticasone 50 MCG/ACT nasal spray Commonly known as: FLONASE 2 spray in each nostril   gatifloxacin 0.5 % Soln Commonly known as: ZYMAXID Place 1 drop into the right  eye 4 (four) times daily.   ipratropium 0.03 % nasal spray Commonly known as: ATROVENT Place 2 sprays into both nostrils 2 (two) times daily as needed for rhinitis. Started by: Verlee Monte   lisinopril-hydrochlorothiazide 20-25 MG tablet Commonly known as: ZESTORETIC   losartan-hydrochlorothiazide 100-25 MG tablet Commonly known as: HYZAAR Take 1 tablet by mouth daily.   Magnesium Chloride 64 MG Tabs 1 tablet   Pfizer COVID-19 Vac Bivalent injection Generic drug: COVID-19 mRNA bivalent vaccine (Pfizer) Inject into  the muscle.   Pfizer-BioNT COVID-19 Vac-TriS Susp injection Generic drug: COVID-19 mRNA Vac-TriS (Pfizer) Inject into the muscle.   Comirnaty syringe Generic drug: COVID-19 mRNA vaccine 2023-2024 Inject into the muscle.   Prolensa 0.07 % Soln Generic drug: Bromfenac Sodium Place 1 drop into the left eye at bedtime.   telmisartan 80 MG tablet Commonly known as: MICARDIS Take 80 mg by mouth daily.   telmisartan 80 MG tablet Commonly known as: MICARDIS Take 80 mg by mouth daily.   Vitamin D (Cholecalciferol) 25 MCG (1000 UT) Caps Take 1 capsule by mouth daily.       Past Medical History:  Diagnosis Date   Asthma    Eczema    Hip dislocation    Hypertension    Seasonal allergies    Past Surgical History:  Procedure Laterality Date   ADENOIDECTOMY     TONSILLECTOMY     Otherwise, there have been no changes to his past medical history, surgical history, family history, or social history.  ROS: All others negative except as noted per HPI.   Objective:  BP (!) 140/82 (BP Location: Left Arm, Patient Position: Sitting, Cuff Size: Normal)   Pulse 99   Temp 98.4 F (36.9 C) (Temporal)   Resp 16   Ht 5' 3.5" (1.613 m)   Wt 165 lb 9.6 oz (75.1 kg)   SpO2 97%   BMI 28.87 kg/m  Body mass index is 28.87 kg/m. Physical Exam: General Appearance:  Alert, cooperative, no distress, appears stated age  Head:  Normocephalic, without obvious abnormality, atraumatic  Eyes:  Conjunctiva clear, EOM's intact  Nose: Nares normal, normal mucosa and no visible anterior polyps  Throat: Lips, tongue normal; teeth and gums normal, + cobblestoning  Neck: Supple, symmetrical  Lungs:   clear to auscultation bilaterally, Respirations unlabored,  productive sounding cough  Heart:  regular rate and rhythm and no murmur, Appears well perfused  Extremities: No edema  Skin: Telangiectasias of nasal tip  Neurologic: No gross deficits   Spirometry:  Tracings reviewed. His effort: Good  reproducible efforts. FVC: 2.50L FEV1: 1.70L, 69% predicted FEV1/FVC ratio: 0.68 Interpretation: Nonobstructive ratio, low FEV1. Similar to previous. Please see scanned spirometry results for details.  Assessment/Plan  Current cough-considered uncontrolled asthma, chronic bronchitis and/or chronic sinusitis.   Eczema: controlled Continue Dupixent injections every 4 weeks. If cough not improved with the below plan, go back to every 2 weeks injection. Continue twice a day moisturization program Continue Eucrisa 2% ointment 1 application twice a day as needed to red itchy areas  Mild intermittent asthma-not at goal;  Start Symbicort 80 mcg taking 2 puffs twice a day with spacer to help prevent cough and wheeze.    - use with spacer  - rinse mouth out after use  - let us know if not covered by your insurance. Continue albuterol 2 puffs every 4 hours as needed for cough, wheeze, tightness in chest, or shortness of breath.  Also may use 2 puffs 5  to 15 minutes prior to exercise Consider increasing dupixent frequency as above.  Seasonal allergic rhinitis-not at goal, increased post nasal drainage. May use an over-the-counter antihistamine once a day as needed for runny nose such as Claritin, Zyrtec, Allegra, or Xyzal Continue fluticasone nasal spray using 2 sprays each nostril once a day as needed for stuffy nose Consider atrovent 1-2 sprays up to twice daily for post nasal drainage  Elevated blood pressure reading  - known HTN - reports white coat syndrome - repeated and remained high - patient aware, discuss with PCP.  If no improvement with these changes, let us know. Would consider a course of antibiotics to help with current cough. - would consider bactrim given listed penicillin allergy.  Please let us know if this treatment plan is not working well for you Schedule a follow-up appointment in 3-6 months or sooner if needed  Tonny Bollman, MD  [facility]

## 2023-03-08 NOTE — Patient Instructions (Addendum)
Eczema (hand) Continue Dupixent injections every 4 weeks. If cough not improved with the below plan, go back to every 2 weeks injection. Continue twice a day moisturization program Continue Eucrisa 2% ointment 1 application twice a day as needed to red itchy areas  Mild intermittent asthma Start Symbicort 80 mcg taking 2 puffs twice a day with spacer to help prevent cough and wheeze.    - use with spacer  - rinse mouth out after use  - let us know if not covered by your insurance. Continue albuterol 2 puffs every 4 hours as needed for cough, wheeze, tightness in chest, or shortness of breath.  Also may use 2 puffs 5 to 15 minutes prior to exercise  Seasonal allergic rhinitis May use an over-the-counter antihistamine once a day as needed for runny nose such as Claritin, Zyrtec, Allegra, or Xyzal Continue fluticasone nasal spray using 2 sprays each nostril once a day as needed for stuffy nose Consider atrovent 1-2 sprays up to twice daily for post nasal drainage  If no improvement with these changes, let us know. Would consider a course of antibiotics to help with current cough.  Please let us know if this treatment plan is not working well for you Schedule a follow-up appointment in 3-6 months or sooner if needed

## 2023-03-09 ENCOUNTER — Encounter: Payer: Self-pay | Admitting: Podiatry

## 2023-03-09 ENCOUNTER — Ambulatory Visit: Payer: Medicare Other | Admitting: Podiatry

## 2023-03-09 DIAGNOSIS — L603 Nail dystrophy: Secondary | ICD-10-CM

## 2023-03-09 DIAGNOSIS — B351 Tinea unguium: Secondary | ICD-10-CM

## 2023-03-09 DIAGNOSIS — G621 Alcoholic polyneuropathy: Secondary | ICD-10-CM

## 2023-03-09 DIAGNOSIS — L601 Onycholysis: Secondary | ICD-10-CM | POA: Diagnosis not present

## 2023-03-09 NOTE — Progress Notes (Signed)
  Chief Complaint  Patient presents with   Nail Problem    Tingling  and numbing sensation in both feet.   HPI: 77 y.o. male presents today with concern of bilateral foot tingling and pins and needle sensations.  He notes that he is not diabetic.  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  States that he used to drink a lot.  He notes that his primary care physician has him supplementing with B12 supplements.  He is wondering if there are any other recommendations regarding supplements.  He does not feel that he is ready to take a prescription nerve medication for the rest of his life just yet.  Also has concern of possible nail fungus.  The left great toenail is the most involved.  Past Medical History:  Diagnosis Date   Asthma    Eczema    Hip dislocation    Hypertension    Seasonal allergies    Past Surgical History:  Procedure Laterality Date   ADENOIDECTOMY     TONSILLECTOMY      Allergies  Allergen Reactions   Penicillins Nausea Only    Physical Exam: There were no vitals filed for this visit.  General: The patient is alert and oriented x3 in no acute distress.  Dermatology: Skin is warm, dry and supple bilateral lower extremities. Interspaces are clear of maceration and debris.  Left hallux nail is 3 mm thick with yellow discoloration subungual debris and distal onycholysis.  There is pain with compression.  Vascular: Palpable pedal pulses bilaterally. Capillary refill within normal limits.  No appreciable edema.  No erythema or calor.  Neurological: Light touch sensation grossly intact bilateral feet.  Temperature sensation intact bilateral.  Vibratory sensation diminished bilateral.  Protective sensation intact bilateral with Semmes Weinstein monofilament  Assessment/Plan of Care: 1. Onychomycosis of toenail   2. Nail dystrophy   3. Alcoholic peripheral neuropathy (HCC)    Discussed clinical findings with patient today.  Samples of the left hallux  nail via clippings with a sterile nail nipper were obtained to send to [facility] laboratories for fungal culture and sensitivity.  For the patient we will call him to review the results and get him started on the appropriate treatment regimen at that time.  Regarding his neuropathy, discussed how alcoholic neuropathy is a valid cause of neuropathy.  Sometimes this ends up being a diagnosis of exclusion if they cannot pinpoint the exact cause.  Recommend that he not only supplement with B12, but I also recommend that he supplement with B6, folic acid, and alpha lipoic acid.  I informed him that there are many supplements on [employer] that we will have all of these vitamins/minerals in 1 bottle of capsules that he can take daily.  Informed him he may need to wait 30 to 60 days to see any improvement from these medications.  If he does not have any improvement at that time he may consider gabapentin.  Clerance Lav, DPM, FACFAS [facility]     [address] [place], [place] [address]                Office [phone]  Fax [phone]

## 2023-03-10 ENCOUNTER — Telehealth: Payer: Self-pay | Admitting: Internal Medicine

## 2023-03-10 NOTE — Telephone Encounter (Signed)
Patient called and stated he was confused on the medication that was prescribed to him and requested a call back at [phone].

## 2023-03-10 NOTE — Telephone Encounter (Signed)
Spoke to pharmacy and pharmacist stated that they need to order the medication for the patient. They will notify him when it is ready for pick up/ Called patient and let him know this and he stated that he understood.

## 2023-03-11 ENCOUNTER — Encounter: Payer: Self-pay | Admitting: Podiatry

## 2023-03-11 DIAGNOSIS — D510 Vitamin B12 deficiency anemia due to intrinsic factor deficiency: Secondary | ICD-10-CM | POA: Insufficient documentation

## 2023-03-11 DIAGNOSIS — L309 Dermatitis, unspecified: Secondary | ICD-10-CM | POA: Insufficient documentation

## 2023-03-11 DIAGNOSIS — I739 Peripheral vascular disease, unspecified: Secondary | ICD-10-CM | POA: Insufficient documentation

## 2023-03-11 DIAGNOSIS — I10 Essential (primary) hypertension: Secondary | ICD-10-CM | POA: Insufficient documentation

## 2023-03-11 DIAGNOSIS — R202 Paresthesia of skin: Secondary | ICD-10-CM | POA: Insufficient documentation

## 2023-04-20 ENCOUNTER — Other Ambulatory Visit (HOSPITAL_BASED_OUTPATIENT_CLINIC_OR_DEPARTMENT_OTHER): Payer: Self-pay

## 2023-04-20 MED ORDER — AREXVY 120 MCG/0.5ML IM SUSR
0.5000 mL | Freq: Once | INTRAMUSCULAR | 0 refills | Status: AC
  Filled 2023-04-20: qty 0.5, 1d supply, fill #0

## 2023-05-17 ENCOUNTER — Encounter: Payer: Self-pay | Admitting: Podiatry

## 2023-05-31 ENCOUNTER — Ambulatory Visit: Payer: Medicare Other | Admitting: Internal Medicine

## 2023-05-31 ENCOUNTER — Other Ambulatory Visit: Payer: Self-pay

## 2023-05-31 ENCOUNTER — Encounter: Payer: Self-pay | Admitting: Internal Medicine

## 2023-05-31 VITALS — BP 144/88 | HR 88 | Temp 98.5°F | Ht 63.0 in | Wt 161.2 lb

## 2023-05-31 DIAGNOSIS — J4520 Mild intermittent asthma, uncomplicated: Secondary | ICD-10-CM | POA: Diagnosis not present

## 2023-05-31 DIAGNOSIS — L2084 Intrinsic (allergic) eczema: Secondary | ICD-10-CM

## 2023-05-31 DIAGNOSIS — J3089 Other allergic rhinitis: Secondary | ICD-10-CM

## 2023-05-31 DIAGNOSIS — J302 Other seasonal allergic rhinitis: Secondary | ICD-10-CM | POA: Diagnosis not present

## 2023-05-31 MED ORDER — ALBUTEROL SULFATE HFA 108 (90 BASE) MCG/ACT IN AERS: 2.0000 | INHALATION_SPRAY | Freq: Four times a day (QID) | RESPIRATORY_TRACT | 2 refills | Status: AC | PRN

## 2023-05-31 MED ORDER — BUDESONIDE-FORMOTEROL FUMARATE 80-4.5 MCG/ACT IN AERO: 2.0000 | INHALATION_SPRAY | Freq: Two times a day (BID) | RESPIRATORY_TRACT | 5 refills | Status: DC

## 2023-05-31 NOTE — Progress Notes (Signed)
FOLLOW UP Date of Service/Encounter:  05/31/23  Subjective:  Grant Gonzalez (DOB: August 20, 1946) is a 77 y.o. 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  History obtained from: chart review and patient.  For Review, LV was on 03/08/23  with Dr.Camaryn Lumbert seen for routine follow-up. See below for summary of history and diagnostics.   Therapeutic plans/changes recommended: FEV1 69%.  We discussed starting Symbicort 82 puffs twice a day with spacer and considering increasing Dupixent every 2 weeks if asthma remains uncontrolled.  We also added Atrovent nasal spray to help with postnasal drip. ----------------------------------------------------- Pertinent History/Diagnostics:  Asthma: He is a trigger.  Preferred Flovent over Qvar RediHaler.  No exercise intolerance.  Goes to gym at least 3 times a week. Eczema: On Dupixent, home injections every 4 weeks.  Spaced out to monthly over time and eczema controlled, but asthma or symptomatic. Chronic rhinitis: Reported phlegm and postnasal drainage on Flonase and Xyzal --------------------------------------------------- Today presents for follow-up. Discussed the use of AI scribe software for clinical note transcription with the patient, who gave verbal consent to proceed.  History of Present Illness   The patient, with a history of eczema, elevated BP, and respiratory issues, reports that his eczema is currently stable. He is on a regimen of Dupixent every four weeks. He monitors his blood pressure at home, with recent readings averaging around 127/70, although he notes that his blood pressure tends to be higher during clinic visits.  Regarding his respiratory issues, the patient has been switched to Symbicort from a previous inhaler (QVAR). He reports feeling better since the switch, but admits to not  always using it as directed, often forgetting the morning dose. He has not missed any evening doses and is making an effort to remember the morning dose. He has not needed to use his rescue inhaler, albuterol, nor has he needed any prednisone or antibiotics. He continues to use Flonase and an antihistamine for allergies.  The patient also uses Atrovent nasal spray and a Flonase nasal spray to manage phlegm going down the back of his throat. He occasionally uses a saline solution at night to prevent nasal dryness and bleeding. He reports that he has not had any nosebleeds for a while.  The patient also mentions neuropathy in his feet. He has been taking a supplement called Nervive, which contains alpha folic acid, as recommended by his podiatrist. He is considering switching to a simpler medication regimen, as he finds some of the pills difficult to swallow.  Overall, the patient feels that his condition has improved. He reports less coughing and no significant shortness of breath or waking up due to coughing. He is active, going to the gym three times a week, and is preparing for an upcoming colonoscopy.      All medications reviewed by clinical staff and updated in chart. No new pertinent medical or surgical history except as noted in HPI.  ROS: All others negative except as noted per HPI.   Objective:  BP (!) 144/88   Pulse 88   Temp 98.5 F (36.9 C) (Temporal)   Ht 5\' 3"  (1.6 m)   Wt 161 lb 3.2 oz (73.1 kg)   SpO2 97%   BMI 28.56 kg/m  Body mass index is 28.56 kg/m. Physical Exam: General Appearance:  Alert, cooperative, no distress, appears stated age  Head:  Normocephalic, without obvious abnormality, atraumatic  Eyes:  Conjunctiva clear, EOM's intact  Ears EACs normal bilaterally and normal TMs bilaterally  Nose: Nares normal, hypertrophic turbinates, normal mucosa, and no visible anterior polyps  Throat: Lips, tongue normal; teeth and gums normal, normal posterior oropharynx   Neck: Supple, symmetrical  Lungs:   clear to auscultation bilaterally, Respirations unlabored, no coughing  Heart:  regular rate and rhythm and no murmur, Appears well perfused  Extremities: No edema  Skin: Skin color, texture, turgor normal and no rashes or lesions on visualized portions of skin  Neurologic: No gross deficits   Labs:  Lab Orders  No laboratory test(s) ordered today    Spirometry:  Tracings reviewed. His effort: Good reproducible efforts. FVC: 2.83L FEV1: 1.69L, 69% predicted FEV1/FVC ratio: 0.60 Interpretation: Spirometry consistent with moderate obstructive disease.  Please see scanned spirometry results for details.  Assessment/Plan   Eczema (hand)-at goal Stable on Dupixent every four weeks. Continue Dupixent injections every 4 weeks. If cough not improved with the below plan, go back to every 2 weeks injection. Continue twice a day moisturization program Continue Eucrisa 2% ointment 1 application twice a day as needed to red itchy areas  Mild intermittent asthma-at goal Spirometry with mild obstruction. Patient reports improvement in symptoms since switching to Symbicort, but admits to inconsistent use in the morning. Symbicort 80 mcg taking 2 puffs twice a day with spacer to help prevent cough and wheeze.    - use with spacer  - rinse mouth out after use Continue albuterol 2 puffs every 4 hours as needed for cough, wheeze, tightness in chest, or shortness of breath.  Also may use 2 puffs 5 to 15 minutes prior to exercise  Seasonal allergic rhinitis-at goal Patient is using Flonase and Xyzal (levocetirizine) with good control of symptoms. May use an over-the-counter antihistamine once a day as needed for runny nose such as Claritin, Zyrtec, Allegra, or Xyzal Continue fluticasone nasal spray using 2 sprays each nostril once a day as needed for stuffy nose Consider atrovent 1-2 sprays up to twice daily for post nasal drainage  Please let us know if this  treatment plan is not working well for you Schedule a follow-up appointment in 6 months or sooner if needed  Other: none  Tonny Bollman, MD  [facility]

## 2023-05-31 NOTE — Patient Instructions (Addendum)
Eczema (hand) Continue Dupixent injections every 4 weeks. If cough not improved with the below plan, go back to every 2 weeks injection. Continue twice a day moisturization program Continue Eucrisa 2% ointment 1 application twice a day as needed to red itchy areas  Mild intermittent asthma Symbicort 80 mcg taking 2 puffs twice a day with spacer to help prevent cough and wheeze.    - use with spacer  - rinse mouth out after use Continue albuterol 2 puffs every 4 hours as needed for cough, wheeze, tightness in chest, or shortness of breath.  Also may use 2 puffs 5 to 15 minutes prior to exercise  Seasonal allergic rhinitis May use an over-the-counter antihistamine once a day as needed for runny nose such as Claritin, Zyrtec, Allegra, or Xyzal Continue fluticasone nasal spray using 2 sprays each nostril once a day as needed for stuffy nose Consider atrovent 1-2 sprays up to twice daily for post nasal drainage  Please let us know if this treatment plan is not working well for you Schedule a follow-up appointment in 6 months or sooner if needed

## 2023-06-02 ENCOUNTER — Telehealth: Payer: Self-pay | Admitting: Internal Medicine

## 2023-06-02 NOTE — Telephone Encounter (Signed)
Patient called and stated the medication albuterol albuterol (VENTOLIN HFA) 108 (90 Base) MCG/ACT inhaler was filled and it shouldn't have been and requested a call back.

## 2023-06-02 NOTE — Telephone Encounter (Signed)
Called patient - DOB verified - advised of provider's directions per 05/31/23 office visit:  Symbicort 80 mcg taking 2 puffs twice a day with spacer to help prevent cough and wheeze.               - use with spacer             - rinse mouth out after use Continue albuterol 2 puffs every 4 hours as needed for cough, wheeze, tightness in chest, or shortness of breath.  Also may use 2 puffs 5 to 15 minutes prior to exercise  Patient verbalized understanding - after reading his AVS, no further questions.

## 2023-06-16 ENCOUNTER — Other Ambulatory Visit (HOSPITAL_BASED_OUTPATIENT_CLINIC_OR_DEPARTMENT_OTHER): Payer: Self-pay

## 2023-06-16 MED ORDER — INFLUENZA VAC A&B SURF ANT ADJ 0.5 ML IM SUSY
0.5000 mL | PREFILLED_SYRINGE | Freq: Once | INTRAMUSCULAR | 0 refills | Status: AC
  Filled 2023-06-16: qty 0.5, 1d supply, fill #0

## 2023-09-02 ENCOUNTER — Other Ambulatory Visit: Payer: Self-pay

## 2023-09-02 ENCOUNTER — Other Ambulatory Visit: Payer: Self-pay | Admitting: Internal Medicine

## 2023-09-15 ENCOUNTER — Other Ambulatory Visit: Payer: Self-pay | Admitting: Internal Medicine

## 2023-11-17 ENCOUNTER — Telehealth: Payer: Self-pay | Admitting: Podiatry

## 2023-11-17 ENCOUNTER — Encounter: Payer: Self-pay | Admitting: Podiatry

## 2023-11-17 NOTE — Telephone Encounter (Signed)
 Pt wants to know if you have received results from culture on toe? Please notify with an update. Thank you

## 2023-11-23 ENCOUNTER — Ambulatory Visit: Admitting: Podiatry

## 2023-11-29 ENCOUNTER — Encounter: Payer: Self-pay | Admitting: Internal Medicine

## 2023-11-29 ENCOUNTER — Ambulatory Visit: Payer: Medicare Other | Admitting: Internal Medicine

## 2023-11-29 ENCOUNTER — Other Ambulatory Visit: Payer: Self-pay

## 2023-11-29 VITALS — BP 128/72 | HR 93 | Temp 98.7°F | Resp 20

## 2023-11-29 DIAGNOSIS — J4520 Mild intermittent asthma, uncomplicated: Secondary | ICD-10-CM

## 2023-11-29 DIAGNOSIS — J3089 Other allergic rhinitis: Secondary | ICD-10-CM | POA: Diagnosis not present

## 2023-11-29 DIAGNOSIS — L308 Other specified dermatitis: Secondary | ICD-10-CM | POA: Diagnosis not present

## 2023-11-29 MED ORDER — BUDESONIDE-FORMOTEROL FUMARATE 80-4.5 MCG/ACT IN AERO: 2.0000 | INHALATION_SPRAY | Freq: Two times a day (BID) | RESPIRATORY_TRACT | 5 refills | Status: DC

## 2023-11-29 MED ORDER — TRIAMCINOLONE ACETONIDE 0.1 % EX OINT: TOPICAL_OINTMENT | CUTANEOUS | 1 refills | Status: AC

## 2023-11-29 MED ORDER — EUCRISA 2 % EX OINT: 1.0000 | TOPICAL_OINTMENT | Freq: Two times a day (BID) | CUTANEOUS | Status: AC | PRN

## 2023-11-29 NOTE — Progress Notes (Signed)
 Medication Samples have been provided to the patient.  Drug name: Pam Drown       Strength: 2%        Qty: 4  LOT: [id number]  Exp.Date: 02/20/2026  Dosing instructions: 1 application twice a day as needed to red itchy areas.   The patient has been instructed regarding the correct time, dose, and frequency of taking this medication, including desired effects and most common side effects.   Glena Norfolk 3:52 PM 11/29/2023

## 2023-11-29 NOTE — Progress Notes (Signed)
 He should be good his Rx is already written for every 14 days

## 2023-11-29 NOTE — Progress Notes (Signed)
 FOLLOW UP Date of Service/Encounter:  11/29/23  Subjective:  Grant Gonzalez (DOB: 10-06-1945) is a 78 y.o. male who [facility] in re-evaluation of the following: asthma, eczema, chronic rhinitis History obtained from: chart review and patient.  For Review, LV was on 05/31/23  with Dr.Nat Lowenthal seen for routine follow-up. See below for summary of history and diagnostics.   Therapeutic plans/changes recommended: FEV1 69%, moderate obstruction, taking dupixnet every 4 weeks and not compliant with symbicort, discussed increasing dupixent to every 2 weeks but he was reluctant to do so ----------------------------------------------------- Pertinent History/Diagnostics:  Asthma: He is a trigger.  Preferred Flovent over Qvar RediHaler.  No exercise intolerance.  Goes to gym at least 3 times a week. Eczema: On Dupixent, home injections every 4 weeks.  Spaced out to monthly over time and eczema controlled, but asthma still symptomatic. Chronic rhinitis: Reported phlegm and postnasal drainage on Flonase and Xyzal --------------------------------------------------- Today presents for follow-up.   History of Present Illness   Grant Gonzalez "Grant Gonzalez" is a 78 year old male with eczema and asthma who presents with an eczema flare-up.  He is experiencing a flare-up of his eczema, which he describes as the worst in years. The flare-up began approximately two weeks to a month ago and has returned to the same spot it originally started years ago, specifically on his wrist and the side of his hand. He has previously used [place], a topical treatment, and is inquiring about obtaining more samples. He has not used topical steroids recently but is open to using them again for this flare-up.  He has a history of asthma, which he describes as 'borderline' despite feeling well. He uses Symbicort 80/4.5 mcg, typically at night, and occasionally forgets the morning dose. He also  uses albuterol infrequently, mainly after gym workouts, and reports having a full container of it. He has not required oral steroids or antibiotics recently.  He mentions a past involvement in a clinical study for Dupixent, which he continues to use, and he has a supply that has carried him into the current year. He is aware that his prescription for Dupixent is expiring and needs a refill. He self spaced himself out to every 4 weeks, but feels that he needs to go back to every 2 weeks.  He has not needed his ipratropium nasal spray recently and does not require a refill at this time.       All medications reviewed by clinical staff and updated in chart. No new pertinent medical or surgical history except as noted in HPI.  ROS: All others negative except as noted per HPI.   Objective:  BP 128/72 (BP Location: Right Arm, Patient Position: Sitting, Cuff Size: Normal)   Pulse 93   Temp 98.7 F (37.1 C) (Temporal)   Resp 20   SpO2 97%  There is no height or weight on file to calculate BMI. Physical Exam: General Appearance:  Alert, cooperative, no distress, appears stated age  Head:  Normocephalic, without obvious abnormality, atraumatic  Eyes:  Conjunctiva clear, EOM's intact  Ears EACs normal bilaterally and normal TMs bilaterally  Nose: Nares normal, normal mucosa and no visible anterior polyps  Throat: Lips, tongue normal; teeth and gums normal, normal posterior oropharynx  Neck: Supple, symmetrical  Lungs:   clear to auscultation bilaterally, Respirations unlabored, no coughing  Heart:  regular rate and rhythm and no murmur, Appears well perfused  Extremities: No edema  Skin: erythematous, dry patches scattered  on right wrist and left side of hands  Neurologic: No gross deficits   Labs:  Lab Orders  No laboratory test(s) ordered today    Spirometry:  Tracings reviewed. His effort: Good reproducible efforts. FVC: 2.66L FEV1: 1.67L, 68% predicted FEV1/FVC ratio:  0.63 Interpretation: Nonobstructive ratio, low FEV1. Borderline obstruction.  Please see scanned spirometry results for details.  Assessment/Plan   Eczema (hand)-not at goal Interval Hx: not controlled on every 4 weeks of dupixent, recommend every 2 week dosing Increase Dupixent injections every 2 weeks.  Continue twice a day moisturization program Continue Eucrisa 2% ointment 1 application twice a day as needed to red itchy areas Start triamcinolone 0.1% twice daily as needed on flare wrist until resolves  Mild intermittent asthma-at goal/stable Increase dupixent every 2 weeks Symbicort 80 mcg taking 2 puffs twice a day with spacer to help prevent cough and wheeze.    - use with spacer  - rinse mouth out after use Continue albuterol 2 puffs every 4 hours as needed for cough, wheeze, tightness in chest, or shortness of breath.  Also may use 2 puffs 5 to 15 minutes prior to exercise  Seasonal allergic rhinitis-at goal May use an over-the-counter antihistamine once a day as needed for runny nose such as Claritin, Zyrtec, Allegra, or Xyzal Continue fluticasone nasal spray using 2 sprays each nostril once a day as needed for stuffy nose Consider atrovent 1-2 sprays up to twice daily for post nasal drainage  Please let us know if this treatment plan is not working well for you Schedule a follow-up appointment in 6 months or sooner if needed  Other: samples provided of: Ruby Cola, MD  [facility]

## 2023-11-29 NOTE — Patient Instructions (Addendum)
 Eczema (hand)- Interval Hx: not controlled on every 4 weeks of dupixent, recommend every 2 week dosing Increase Dupixent injections every 2 weeks.  Continue twice a day moisturization program Continue Eucrisa 2% ointment 1 application twice a day as needed to red itchy areas Start triamcinolone 0.1% twice daily as needed on flare wrist until resolves  Mild intermittent asthma Increase dupixent every 2 weeks Symbicort 80 mcg taking 2 puffs twice a day with spacer to help prevent cough and wheeze.    - use with spacer  - rinse mouth out after use Continue albuterol 2 puffs every 4 hours as needed for cough, wheeze, tightness in chest, or shortness of breath.  Also may use 2 puffs 5 to 15 minutes prior to exercise  Seasonal allergic rhinitis May use an over-the-counter antihistamine once a day as needed for runny nose such as Claritin, Zyrtec, Allegra, or Xyzal Continue fluticasone nasal spray using 2 sprays each nostril once a day as needed for stuffy nose Consider atrovent 1-2 sprays up to twice daily for post nasal drainage  Please let us know if this treatment plan is not working well for you Schedule a follow-up appointment in 6 months or sooner if needed

## 2023-11-30 ENCOUNTER — Encounter: Payer: Self-pay | Admitting: Podiatry

## 2023-11-30 ENCOUNTER — Ambulatory Visit: Admitting: Podiatry

## 2023-11-30 VITALS — Ht 63.0 in | Wt 161.2 lb

## 2023-11-30 DIAGNOSIS — M79675 Pain in left toe(s): Secondary | ICD-10-CM | POA: Diagnosis not present

## 2023-11-30 DIAGNOSIS — B351 Tinea unguium: Secondary | ICD-10-CM | POA: Diagnosis not present

## 2023-11-30 NOTE — Progress Notes (Unsigned)
       Subjective:  Patient ID: Grant Gonzalez, male    DOB: [birth year],  MRN: [id number]  Grant Gonzalez presents to clinic today for:  Chief Complaint  Patient presents with   Nail Problem    Pt is here due to left great toenail issue states he wants the nail removed.   Patient presents requesting removal of the left great toenail.  He has had continued issues with the nail and would like to have it removed permanently.  He notes that it is a constant issue with pressure from shoes and it is always uncomfortable.  He is also requesting trimming of the rest of his toenails  PCP is Tally Joe, MD.  Allergies  Allergen Reactions   Penicillins Nausea Only   Review of Systems: Negative except as noted in the HPI.  Objective:  Grant Gonzalez is a pleasant 78 y.o. male in NAD. AAO x 3.  Vascular Examination: Capillary refill time is 3-5 seconds to toes bilateral. Palpable pedal pulses b/l LE. Digital hair present b/l. No pedal edema b/l. Skin temperature gradient WNL b/l. No varicosities b/l. No cyanosis or clubbing noted b/l.   Dermatological Examination: There is pain on palpation of the left hallux toenail.  There is yellow and brown discoloration of the nail with 3 to 4 mm of thickness.  No surrounding erythema is noted.  There is some distal onycholysis noted  Assessment/Plan: 1. Onychomycosis of toenail   2. Pain in left toe(s)     Discussed patient's condition today.  After obtaining patient consent, the left hallux was anesthetized with a 50:50 mixture of 1% lidocaine plain and 0.5% bupivacaine plain for a total of 3cc's administered.  Upon confirmation of anesthesia, a freer elevator was utilized to Gonzalez the left hallux nail unit from the nail bed.  The nail was then avulsed proximal to the eponychium and removed in toto.  The area was inspected for any remaining spicules.  A chemical matrixectomy was performed with NaOH and neutralized with acetic acid solution.  Antibiotic  ointment and a DSD were applied, followed by a Coban dressing.  Patient tolerated the anesthetic and procedure well and will f/u in 2-3 weeks for recheck.  Patient given post-procedure instructions for daily 15-minute Epsom salt soaks, antibiotic ointment and daily use of Bandaids until toe starts to dry / form eschar.   Informed patient we will have to push off the nail trimming until his follow-up appointment as we do not want any nail dust from the burring/smoothing of the nails getting into the area where the nail was avulsed as this could cause a painful reaction.  Return in about 2 weeks (around 12/14/2023) for PNA recheck & RFC .   Clerance Lav, DPM, FACFAS [facility]     [address] [place], [place] [address]                Office [phone]  Fax [phone]

## 2023-11-30 NOTE — Patient Instructions (Addendum)
 STARTING TOMORROW: Place 1/4 cup of epsom salts in a quart of warm tap water.  Submerge your foot or feet in the solution and soak for 20 minutes.  This soak should be done twice a day.  Next, remove your foot or feet from solution, blot dry the affected area. Apply ointment and cover if instructed by your doctor.   IF YOUR SKIN BECOMES IRRITATED WHILE USING THESE INSTRUCTIONS, IT IS OKAY TO SWITCH TO  WHITE VINEGAR AND WATER.  As another alternative soak, you may use antibacterial soap and water.  Dry the area well after soaking.  Apply a small amount of antibiotic ointment to the area, followed by a Bandaid or light gauze dressing.  As the area starts to dry out over the next few days, you can remove the covering at bedtime to encourage the toe to dry out more.  Once it forms a dry scab, you can discontinue these instructions.  Monitor for any signs/symptoms of infection. Call the office immediately if any occur or go directly to the emergency room. Call with any questions/concerns.

## 2023-12-06 ENCOUNTER — Telehealth: Payer: Self-pay | Admitting: *Deleted

## 2023-12-06 MED ORDER — DUPIXENT 300 MG/2ML ~~LOC~~ SOSY: 300.0000 mg | PREFILLED_SYRINGE | SUBCUTANEOUS | 11 refills | Status: AC

## 2023-12-06 NOTE — Telephone Encounter (Signed)
Spoke to patient and advised rx sent.

## 2023-12-20 ENCOUNTER — Ambulatory Visit: Admitting: Podiatry

## 2023-12-20 DIAGNOSIS — M79674 Pain in right toe(s): Secondary | ICD-10-CM | POA: Diagnosis not present

## 2023-12-20 DIAGNOSIS — B351 Tinea unguium: Secondary | ICD-10-CM

## 2023-12-20 DIAGNOSIS — M79675 Pain in left toe(s): Secondary | ICD-10-CM

## 2023-12-20 NOTE — Progress Notes (Signed)
       Subjective:  Patient ID: Grant Gonzalez, male    DOB: 09-14-1945,  MRN: [id number]   Grant Gonzalez presents to clinic today for:  Chief Complaint  Patient presents with   Nail Problem    Nail trim    Patient notes nails are thick, discolored, elongated and painful in shoegear when trying to ambulate.  He is also here for recheck of the left hallux toenail avulsion area.  PCP is Rae Bugler, MD.  Past Medical History:  Diagnosis Date   Asthma    Eczema    Hip dislocation    Hypertension    Seasonal allergies     Past Surgical History:  Procedure Laterality Date   ADENOIDECTOMY     TONSILLECTOMY      Allergies  Allergen Reactions   Penicillins Nausea Only    Review of Systems: Negative except as noted in the HPI.  Objective:  Grant Gonzalez is a pleasant 78 y.o. male in NAD. AAO x 3.  Vascular Examination: Capillary refill time is 3-5 seconds to toes bilateral. Palpable pedal pulses b/l LE. Digital hair present b/l.  Skin temperature gradient WNL b/l. No varicosities b/l. No cyanosis noted b/l.   Dermatological Examination: Pedal skin with normal turgor, texture and tone b/l. No open wounds. No interdigital macerations b/l. Toenails x9 are 3mm thick, discolored, dystrophic with subungual debris. There is pain with compression of the nail plates.  They are elongated x9.  The nail avulsion site of the left hallux nail has eschar formed.  Minimal tenderness on palpation of the proximal nail fold is noted.  No clinical signs of infection are seen.  Assessment/Plan: 1. Pain due to onychomycosis of toenails of both feet    The mycotic toenails were sharply debrided x9 with sterile nail nippers and a power debriding burr to decrease bulk/thickness and length.    Small amount of antibiotic ointment was applied along the proximal nail fold of the left hallux and a small piece of gauze and Coban wrapping was applied.  Patient continues to wrap the toe for comfort and to  protect against any drainage and bacteria.  Patient prefers to follow-up on an as-needed basis.   Joe Murders, DPM, FACFAS [facility]     [address] [place], [place] [address]                Office [phone]  Fax [phone]

## 2024-01-22 DIAGNOSIS — I10 Essential (primary) hypertension: Secondary | ICD-10-CM | POA: Diagnosis not present

## 2024-01-22 DIAGNOSIS — J4520 Mild intermittent asthma, uncomplicated: Secondary | ICD-10-CM | POA: Diagnosis not present

## 2024-02-17 DIAGNOSIS — E538 Deficiency of other specified B group vitamins: Secondary | ICD-10-CM | POA: Diagnosis not present

## 2024-02-17 DIAGNOSIS — I10 Essential (primary) hypertension: Secondary | ICD-10-CM | POA: Diagnosis not present

## 2024-02-21 DIAGNOSIS — L309 Dermatitis, unspecified: Secondary | ICD-10-CM | POA: Diagnosis not present

## 2024-02-21 DIAGNOSIS — J4520 Mild intermittent asthma, uncomplicated: Secondary | ICD-10-CM | POA: Diagnosis not present

## 2024-02-21 DIAGNOSIS — R252 Cramp and spasm: Secondary | ICD-10-CM | POA: Diagnosis not present

## 2024-02-21 DIAGNOSIS — R202 Paresthesia of skin: Secondary | ICD-10-CM | POA: Diagnosis not present

## 2024-02-21 DIAGNOSIS — I739 Peripheral vascular disease, unspecified: Secondary | ICD-10-CM | POA: Diagnosis not present

## 2024-02-21 DIAGNOSIS — E538 Deficiency of other specified B group vitamins: Secondary | ICD-10-CM | POA: Diagnosis not present

## 2024-02-21 DIAGNOSIS — Z0000 Encounter for general adult medical examination without abnormal findings: Secondary | ICD-10-CM | POA: Diagnosis not present

## 2024-02-21 DIAGNOSIS — J309 Allergic rhinitis, unspecified: Secondary | ICD-10-CM | POA: Diagnosis not present

## 2024-02-21 DIAGNOSIS — I10 Essential (primary) hypertension: Secondary | ICD-10-CM | POA: Diagnosis not present

## 2024-02-21 DIAGNOSIS — Z1211 Encounter for screening for malignant neoplasm of colon: Secondary | ICD-10-CM | POA: Diagnosis not present

## 2024-03-23 DIAGNOSIS — J4520 Mild intermittent asthma, uncomplicated: Secondary | ICD-10-CM | POA: Diagnosis not present

## 2024-03-23 DIAGNOSIS — I10 Essential (primary) hypertension: Secondary | ICD-10-CM | POA: Diagnosis not present

## 2024-05-23 DIAGNOSIS — H5203 Hypermetropia, bilateral: Secondary | ICD-10-CM | POA: Diagnosis not present

## 2024-05-29 ENCOUNTER — Encounter: Payer: Self-pay | Admitting: Internal Medicine

## 2024-05-29 ENCOUNTER — Ambulatory Visit: Admitting: Internal Medicine

## 2024-05-29 ENCOUNTER — Other Ambulatory Visit: Payer: Self-pay

## 2024-05-29 VITALS — BP 140/92 | HR 104 | Temp 98.1°F | Ht 63.0 in | Wt 161.6 lb

## 2024-05-29 DIAGNOSIS — J3089 Other allergic rhinitis: Secondary | ICD-10-CM | POA: Diagnosis not present

## 2024-05-29 DIAGNOSIS — J4520 Mild intermittent asthma, uncomplicated: Secondary | ICD-10-CM | POA: Diagnosis not present

## 2024-05-29 DIAGNOSIS — J302 Other seasonal allergic rhinitis: Secondary | ICD-10-CM | POA: Diagnosis not present

## 2024-05-29 DIAGNOSIS — L2084 Intrinsic (allergic) eczema: Secondary | ICD-10-CM | POA: Diagnosis not present

## 2024-05-29 MED ORDER — BUDESONIDE-FORMOTEROL FUMARATE 80-4.5 MCG/ACT IN AERO: 2.0000 | INHALATION_SPRAY | Freq: Two times a day (BID) | RESPIRATORY_TRACT | 5 refills | Status: AC

## 2024-05-29 MED ORDER — IPRATROPIUM BROMIDE 0.03 % NA SOLN: 2.0000 | Freq: Two times a day (BID) | NASAL | 1 refills | Status: AC | PRN

## 2024-05-29 NOTE — Patient Instructions (Addendum)
 Eczema (hand)- Increase Dupixent  injections every 3 weeks.  Continue twice a day moisturization program Continue Eucrisa  2% ointment 1 application twice a day as needed to red itchy areas Continue triamcinolone  0.1% twice daily as needed on flare wrist until resolves  Mild intermittent asthma Symbicort  80 mcg taking 2 puffs twice a day with spacer to help prevent cough and wheeze.    - use with spacer  - rinse mouth out after use Continue albuterol  2 puffs every 4 hours as needed for cough, wheeze, tightness in chest, or shortness of breath.  Also may use 2 puffs 5 to 15 minutes prior to exercise  Seasonal allergic rhinitis May use an over-the-counter antihistamine once a day as needed for runny nose such as Claritin, Zyrtec, Allegra, or Xyzal Continue fluticasone  nasal spray using 2 sprays each nostril once a day as needed for stuffy nose Consider atrovent  1-2 sprays up to twice daily for post nasal drainage  Please let us  know if this treatment plan is not working well for you Schedule a follow-up appointment in 6 months or sooner if needed

## 2024-05-29 NOTE — Progress Notes (Signed)
 FOLLOW UP Date of Service/Encounter:   05/29/2024  Subjective:  Grant Gonzalez (DOB: June 16, 1946) is a 78 y.o. male who [facility] in re-evaluation of the following: asthma, eczema, chronic rhinitis  History obtained from: chart review and patient.  For Review, LV was on 11/29/23  with Dr.Domnique Vantine seen for routine follow-up. See below for summary of history and diagnostics.   Therapeutic plans/changes recommended: reported eczema as flared for which we prescribed eucrisa . FEV1 68%, discussed going back to dupixent  q2 week dosing. ----------------------------------------------------- Pertinent History/Diagnostics:  Asthma: Heat is a trigger.  Preferred Flovent  over Qvar RediHaler.  No exercise intolerance.  Goes to gym at least 3 times a week. LV 11/29/23 using Symbicort  80 and controlled. Takes dupixent  every 4 weeks Eczema: On Dupixent , home injections every 4 weeks.  Spaced out to monthly over time and eczema controlled, but asthma still symptomatic. Chronic rhinitis: Reported phlegm and postnasal drainage on Flonase  and Xyzal --------------------------------------------------- Today presents for follow-up. Discussed the use of AI scribe software for clinical note transcription with the patient, who gave verbal consent to proceed.  History of Present Illness Grant Gonzalez is a 78 year old male who presents for allergy management due to nasal congestion.  Nasal congestion and allergic symptoms - Nasal congestion attributed to environmental changes, possibly ragweed or other weeds - Uses generic fluticasone  nasal spray daily - Uses ipratropium nasal spray twice daily, two sprays each time  Asthma management - Uses Symbicort  twice daily - Uses albuterol  approximately once a week, often prior to gym activity  Biologic therapy for atopic disease - Continues Dupixent  injections every three weeks - Previously used every two weeks;  increasing interval to four weeks resulted in hand discomfort/hand eczema returned - Three-week interval is manageable - Next Dupixent  injection due this coming Sunday  Musculoskeletal pain - Hip pain possibly related to yard work - Pain does not interfere with regular exercise routine  Exercise tolerance and activity level - Exercises three times per week - No limitation in activity due to current symptoms  Blood pressure reactivity - Elevated blood pressure during medical visits attributed to 'white coat syndrome'   All medications reviewed by clinical staff and updated in chart. No new pertinent medical or surgical history except as noted in HPI.  ROS: All others negative except as noted per HPI.   Objective:  BP (!) 140/92 (BP Location: Left Arm, Patient Position: Sitting, Cuff Size: Normal)   Pulse (!) 104   Temp 98.1 F (36.7 C) (Temporal)   Ht 5' 3 (1.6 m)   Wt 161 lb 9.6 oz (73.3 kg)   SpO2 97%   BMI 28.63 kg/m  Body mass index is 28.63 kg/m. Physical Exam: General Appearance:  Alert, cooperative, no distress, appears stated age  Head:  Normocephalic, without obvious abnormality, atraumatic  Eyes:  Conjunctiva clear, EOM's intact  Ears EACs normal bilaterally and normal TMs bilaterally  Nose: Nares normal, hypertrophic turbinates, normal mucosa, and no visible anterior polyps  Throat: Lips, tongue normal; teeth and gums normal, normal posterior oropharynx  Neck: Supple, symmetrical  Lungs:   clear to auscultation bilaterally, Respirations unlabored, no coughing  Heart:  regular rate and rhythm and no murmur, Appears well perfused  Extremities: No edema  Skin: Skin color, texture, turgor normal and no rashes or lesions on visualized portions of skin  Neurologic: No gross deficits   Labs:  Lab Orders  No laboratory test(s) ordered today  Spirometry:  Tracings reviewed. His effort: Good reproducible efforts. FVC: 2.58L FEV1: 1.44L, 59%  predicted FEV1/FVC ratio: 0.56 Interpretation: Spirometry consistent with moderate obstructive disease.  Please see scanned spirometry results for details.  Assessment/Plan   Eczema (hand)-at goal on dupixent  Increase Dupixent  injections every 3 weeks.  Continue twice a day moisturization program Continue Eucrisa  2% ointment 1 application twice a day as needed to red itchy areas Continue triamcinolone  0.1% twice daily as needed on flare wrist until resolves  Mild intermittent asthma-at goal on dupixent  Symbicort  80 mcg taking 2 puffs twice a day with spacer to help prevent cough and wheeze.    - use with spacer  - rinse mouth out after use Continue albuterol  2 puffs every 4 hours as needed for cough, wheeze, tightness in chest, or shortness of breath.  Also may use 2 puffs 5 to 15 minutes prior to exercise  Seasonal allergic rhinitis-at goal May use an over-the-counter antihistamine once a day as needed for runny nose such as Claritin, Zyrtec, Allegra, or Xyzal Continue fluticasone  nasal spray using 2 sprays each nostril once a day as needed for stuffy nose Consider atrovent  1-2 sprays up to twice daily for post nasal drainage  Please let us  know if this treatment plan is not working well for you Schedule a follow-up appointment in 6 months or sooner if needed  Other: none  Rocky Endow, MD  [facility] 

## 2024-06-08 ENCOUNTER — Other Ambulatory Visit (HOSPITAL_BASED_OUTPATIENT_CLINIC_OR_DEPARTMENT_OTHER): Payer: Self-pay

## 2024-06-08 MED ORDER — FLUZONE HIGH-DOSE 0.5 ML IM SUSY
0.5000 mL | PREFILLED_SYRINGE | Freq: Once | INTRAMUSCULAR | 0 refills | Status: AC
  Filled 2024-06-08: qty 0.5, 1d supply, fill #0

## 2024-06-20 DIAGNOSIS — M7061 Trochanteric bursitis, right hip: Secondary | ICD-10-CM | POA: Diagnosis not present

## 2024-07-28 ENCOUNTER — Ambulatory Visit: Admitting: Sports Medicine

## 2024-07-28 ENCOUNTER — Ambulatory Visit: Admitting: Orthopaedic Surgery

## 2024-07-28 ENCOUNTER — Other Ambulatory Visit: Payer: Self-pay

## 2024-07-28 ENCOUNTER — Ambulatory Visit: Payer: Self-pay

## 2024-07-28 ENCOUNTER — Encounter: Payer: Self-pay | Admitting: Sports Medicine

## 2024-07-28 DIAGNOSIS — M25551 Pain in right hip: Secondary | ICD-10-CM | POA: Diagnosis not present

## 2024-07-28 DIAGNOSIS — M160 Bilateral primary osteoarthritis of hip: Secondary | ICD-10-CM

## 2024-07-28 MED ORDER — METHYLPREDNISOLONE ACETATE 40 MG/ML IJ SUSP
80.0000 mg | INTRAMUSCULAR | Status: AC | PRN
  Administered 2024-07-28: 80 mg via INTRA_ARTICULAR

## 2024-07-28 MED ORDER — LIDOCAINE HCL 1 % IJ SOLN
4.0000 mL | INTRAMUSCULAR | Status: AC | PRN
  Administered 2024-07-28: 4 mL

## 2024-07-28 MED ORDER — MELOXICAM 15 MG PO TABS: 15.0000 mg | ORAL_TABLET | Freq: Every day | ORAL | 2 refills | Status: DC

## 2024-07-28 NOTE — Progress Notes (Signed)
   Procedure Note  Patient: Grant Gonzalez             Date of Birth: [birth year]           MRN: [id number]             Visit Date: 07/28/2024  Procedures: Visit Diagnoses:  1. Pain in right hip   2. Bilateral primary osteoarthritis of hip    Large Joint Inj: R hip joint on 07/28/2024 10:28 AM Indications: pain Details: 22 G 3.5 in needle, ultrasound-guided anterior approach Medications: 4 mL lidocaine  1 %; 80 mg methylPREDNISolone  acetate 40 MG/ML Outcome: tolerated well, no immediate complications  Procedure: US -guided intra-articular hip injection, Right After discussion on risks/benefits/indications and informed verbal consent was obtained, a timeout was performed. Patient was lying supine on exam table. The hip was cleaned with betadine and alcohol swabs. Then utilizing ultrasound guidance, the patient's femoral head and neck junction was identified and subsequently injected with 4:2 lidocaine :depomedrol via an in-plane approach with ultrasound visualization of the injectate administered into the hip joint. Patient tolerated procedure well without immediate complications.  Procedure, treatment alternatives, risks and benefits explained, specific risks discussed. Consent was given by the patient. Immediately prior to procedure a time out was called to verify the correct patient, procedure, equipment, support staff and site/side marked as required. Patient was prepped and draped in the usual sterile fashion.     - patient tolerated procedure well, discussed post-injection protocol - follow-up with Dr. Jerri as indicated; I am happy to see them as needed  Lonell Sprang, DO Primary Care Sports Medicine Physician  [facility]  This note was dictated using Dragon naturally speaking software and may contain errors in syntax, spelling, or content which have not been identified prior to signing this note.

## 2024-07-28 NOTE — Progress Notes (Signed)
 Office Visit Note   Patient: Grant Gonzalez           Date of Birth: March 10, 1946           MRN: [id number] Visit Date: 07/28/2024              Requested by: Ricky Alfrieda DASEN, DO [address], [address] [place],  [place] [address] PCP: Seabron Lenis, MD   Assessment & Plan: Visit Diagnoses:  1. Pain in right hip     Plan: 78 year old gentleman with moderately severe right hip DJD.  Detailed discussion was had about treatment options.  Total hip replacement handout provided.  Will send patient to Dr. Burnetta for intra-articular steroid injection.  Prescription for meloxicam .  Follow-Up Instructions: No follow-ups on file.   Orders:  Orders Placed This Encounter  Procedures   XR HIP UNILAT W OR W/O PELVIS 2-3 VIEWS RIGHT   Meds ordered this encounter  Medications   meloxicam  (MOBIC ) 15 MG tablet    Sig: Take 1 tablet (15 mg total) by mouth daily.    Dispense:  14 tablet    Refill:  2      Procedures: No procedures performed   Clinical Data: No additional findings.   Subjective: Chief Complaint  Patient presents with   Right Hip - Pain    HPI Renay is a 78 year old gentleman here for evaluation of right hip and groin pain for months.  Reports start up pain and weakness and stiffness.  Has trouble lifting legs into a car or bed.  Has used ibuprofen tramadol and tizanidine.  Goes to the gym 3 times a week. Review of Systems  Constitutional: Negative.   HENT: Negative.    Eyes: Negative.   Respiratory: Negative.    Cardiovascular: Negative.   Gastrointestinal: Negative.   Endocrine: Negative.   Genitourinary: Negative.   Skin: Negative.   Allergic/Immunologic: Negative.   Neurological: Negative.   Hematological: Negative.   Psychiatric/Behavioral: Negative.    All other systems reviewed and are negative.    Objective: Vital Signs: There were no vitals taken for this visit.  Physical Exam Vitals and nursing note reviewed.  Constitutional:      Appearance:  He is well-developed.  HENT:     Head: Normocephalic and atraumatic.  Eyes:     Pupils: Pupils are equal, round, and reactive to light.  Pulmonary:     Effort: Pulmonary effort is normal.  Abdominal:     Palpations: Abdomen is soft.  Musculoskeletal:        General: Normal range of motion.     Cervical back: Neck supple.  Skin:    General: Skin is warm.  Neurological:     Mental Status: He is alert and oriented to person, place, and time.  Psychiatric:        Behavior: Behavior normal.        Thought Content: Thought content normal.        Judgment: Judgment normal.     Ortho Exam Exam of the right hip shows pain with hip flexion and internal rotation and manipulation of the joint. Specialty Comments:  No specialty comments available.  Imaging: XR HIP UNILAT W OR W/O PELVIS 2-3 VIEWS RIGHT Result Date: 07/28/2024 X-rays of the right hip show advanced degenerative joint disease with near bone-on-bone joint space narrowing.  Cam deformities of the femoral head.    PMFS History: Patient Active Problem List   Diagnosis Date Noted   Paresthesias 03/11/2023  Peripheral vascular disease 03/11/2023   Pernicious anemia 03/11/2023   Essential hypertension 03/11/2023   Eczema 03/11/2023   Intrinsic atopic dermatitis 03/08/2023   Allergic rhinitis due to allergen 03/08/2023   Mild intermittent asthma 09/21/2022   Benign essential HTN 09/21/2022   Past Medical History:  Diagnosis Date   Asthma    Eczema    Hip dislocation    Hypertension    Seasonal allergies     Family History  Problem Relation Age of Onset   Asthma Daughter    Allergic rhinitis Neg Hx    Angioedema Neg Hx    Eczema Neg Hx    Immunodeficiency Neg Hx    Urticaria Neg Hx    Atopy Neg Hx     Past Surgical History:  Procedure Laterality Date   ADENOIDECTOMY     TONSILLECTOMY     Social History   Occupational History   Not on file  Tobacco Use   Smoking status: Former    Current packs/day:  0.00    Types: Cigarettes    Quit date: 08/17/1987    Years since quitting: 36.9   Smokeless tobacco: Never  Substance and Sexual Activity   Alcohol use: Not Currently    Comment: former alcohol abuse   Drug use: Not on file   Sexual activity: Not on file

## 2024-08-07 ENCOUNTER — Telehealth: Payer: Self-pay | Admitting: Orthopaedic Surgery

## 2024-08-07 NOTE — Telephone Encounter (Signed)
 Pt called and wants you to call him. He wants to know if he looking to do on a partial hip replacement or the whole hip replacement. CB#[phone]

## 2024-08-07 NOTE — Telephone Encounter (Signed)
Total  

## 2024-08-08 ENCOUNTER — Encounter: Payer: Self-pay | Admitting: Orthopaedic Surgery

## 2024-08-15 ENCOUNTER — Other Ambulatory Visit: Payer: Self-pay | Admitting: Physician Assistant

## 2024-08-15 MED ORDER — TIZANIDINE HCL 2 MG PO TABS: 2.0000 mg | ORAL_TABLET | Freq: Every day | ORAL | 0 refills | Status: DC | PRN

## 2024-08-15 NOTE — Telephone Encounter (Signed)
 I sent in one small rx.  If he needs refilled, he will need to reach out to pcp or we can refer to pain mgmt

## 2024-08-21 ENCOUNTER — Encounter: Payer: Self-pay | Admitting: Orthopaedic Surgery

## 2024-08-21 NOTE — Telephone Encounter (Signed)
 I would recommend him seeing Grant Gonzalez for his hand/wrist issues. I'm happy to see him back for his hip if he wants.

## 2024-09-01 ENCOUNTER — Other Ambulatory Visit: Payer: Self-pay | Admitting: Radiology

## 2024-09-01 DIAGNOSIS — M25531 Pain in right wrist: Secondary | ICD-10-CM

## 2024-09-01 DIAGNOSIS — M79642 Pain in left hand: Secondary | ICD-10-CM

## 2024-09-01 NOTE — Telephone Encounter (Signed)
 Called and scheduled patient.  Placed referral to Dr. Erwin

## 2024-09-12 ENCOUNTER — Other Ambulatory Visit: Payer: Self-pay | Admitting: Physician Assistant

## 2024-09-12 MED ORDER — MELOXICAM 15 MG PO TABS: 15.0000 mg | ORAL_TABLET | Freq: Every day | ORAL | 0 refills | Status: AC

## 2024-09-12 MED ORDER — TIZANIDINE HCL 2 MG PO TABS: 2.0000 mg | ORAL_TABLET | Freq: Every day | ORAL | 0 refills | Status: AC | PRN

## 2024-09-19 ENCOUNTER — Telehealth: Payer: Self-pay | Admitting: Orthopedic Surgery

## 2024-09-19 ENCOUNTER — Ambulatory Visit: Admitting: Orthopaedic Surgery

## 2024-09-19 NOTE — Telephone Encounter (Signed)
 Called him.  Discussed everything.  He's going to let us  know if he decides to do another injection or move forward with surgery

## 2024-09-19 NOTE — Progress Notes (Unsigned)
" ° °  Grant Gonzalez - 79 y.o. male MRN [id number]  Date of birth: Dec 04, 1945  Office Visit Note: Visit Date: 09/20/2024 PCP: Seabron Lenis, MD Referred by: Jerri Kay HERO, MD  Subjective: No chief complaint on file.  HPI: Dhillon Comunale is a pleasant 79 y.o. male who presents today for ***  Pertinent ROS were reviewed with the patient and found to be negative unless otherwise specified above in HPI.   Visit Reason: Duration of symptoms: Hand dominance: {RIGHT/LEFT:20294} Occupation: Diabetic: {yes/no:20286} Smoking: {yes/no:20286} Heart/Lung History: Blood Thinners:   Prior Testing/EMG: Injections (Date): Treatments: Prior Surgery:    Assessment & Plan: Visit Diagnoses: No diagnosis found.  Plan: ***  Follow-up: No follow-ups on file.   Meds & Orders: No orders of the defined types were placed in this encounter.  No orders of the defined types were placed in this encounter.    Procedures: No procedures performed      Clinical History: No specialty comments available.  He reports that he quit smoking about 37 years ago. His smoking use included cigarettes. He has never used smokeless tobacco. No results for input(s): HGBA1C, LABURIC in the last 8760 hours.  Objective:   Vital Signs: There were no vitals taken for this visit.  Physical Exam  Gen: Well-appearing, in no acute distress; non-toxic CV: Regular Rate. Well-perfused. Warm.  Resp: Breathing unlabored on room air; no wheezing. Psych: Fluid speech in conversation; appropriate affect; normal thought process  Ortho Exam - ***   Imaging: No results found.  Past Medical/Family/Surgical/Social History: Medications & Allergies reviewed per EMR, new medications updated. Patient Active Problem List   Diagnosis Date Noted   Paresthesias 03/11/2023   Peripheral vascular disease 03/11/2023   Pernicious anemia 03/11/2023   Essential hypertension 03/11/2023   Eczema 03/11/2023   Intrinsic atopic  dermatitis 03/08/2023   Allergic rhinitis due to allergen 03/08/2023   Mild intermittent asthma 09/21/2022   Benign essential HTN 09/21/2022   Past Medical History:  Diagnosis Date   Asthma    Eczema    Hip dislocation    Hypertension    Seasonal allergies    Family History  Problem Relation Age of Onset   Asthma Daughter    Allergic rhinitis Neg Hx    Angioedema Neg Hx    Eczema Neg Hx    Immunodeficiency Neg Hx    Urticaria Neg Hx    Atopy Neg Hx    Past Surgical History:  Procedure Laterality Date   ADENOIDECTOMY     TONSILLECTOMY     Social History   Occupational History   Not on file  Tobacco Use   Smoking status: Former    Current packs/day: 0.00    Types: Cigarettes    Quit date: 08/17/1987    Years since quitting: 37.1   Smokeless tobacco: Never  Substance and Sexual Activity   Alcohol use: Not Currently    Comment: former alcohol abuse   Drug use: Not on file   Sexual activity: Not on file    Nalah Macioce Estela) Arlinda, M.D. [facility], Hand Surgery  "

## 2024-09-19 NOTE — Telephone Encounter (Signed)
 Up to xu

## 2024-09-19 NOTE — Telephone Encounter (Signed)
 Mr. Ratchford had to cancel his appt with Dr. Jerri today due to the weather.  He states that the appointment was to discuss hip replacement.  He would like to know if he can just have this conversation over the phone and go ahead and schedule the surgery.  His call back # [phone].

## 2024-09-20 ENCOUNTER — Ambulatory Visit: Admitting: Orthopedic Surgery

## 2024-09-20 DIAGNOSIS — G5603 Carpal tunnel syndrome, bilateral upper limbs: Secondary | ICD-10-CM | POA: Diagnosis not present

## 2024-09-20 MED ORDER — BETAMETHASONE SOD PHOS & ACET 6 (3-3) MG/ML IJ SUSP
6.0000 mg | INTRAMUSCULAR | Status: AC | PRN
  Administered 2024-09-20: 6 mg via INTRA_ARTICULAR

## 2024-09-20 MED ORDER — LIDOCAINE HCL 1 % IJ SOLN
1.0000 mL | INTRAMUSCULAR | Status: AC | PRN
  Administered 2024-09-20: 1 mL

## 2024-10-31 ENCOUNTER — Ambulatory Visit: Admitting: Orthopedic Surgery

## 2024-11-27 ENCOUNTER — Ambulatory Visit: Admitting: Internal Medicine
